# Patient Record
Sex: Male | Born: 1950 | Race: White | Hispanic: No | Marital: Married | Smoking: Never smoker
Health system: Southern US, Community
[De-identification: ages and names within clinical notes are randomized; demographics above are authoritative.]

## PROBLEM LIST (undated history)

## (undated) DIAGNOSIS — I10 Essential (primary) hypertension: Secondary | ICD-10-CM

## (undated) DIAGNOSIS — Z9889 Other specified postprocedural states: Secondary | ICD-10-CM

## (undated) DIAGNOSIS — N200 Calculus of kidney: Secondary | ICD-10-CM

## (undated) DIAGNOSIS — Z87442 Personal history of urinary calculi: Secondary | ICD-10-CM

## (undated) DIAGNOSIS — E7800 Pure hypercholesterolemia, unspecified: Secondary | ICD-10-CM

## (undated) DIAGNOSIS — R7303 Prediabetes: Secondary | ICD-10-CM

## (undated) DIAGNOSIS — G4700 Insomnia, unspecified: Secondary | ICD-10-CM

## (undated) HISTORY — PX: ANKLE SURGERY: SHX546

## (undated) HISTORY — DX: Essential (primary) hypertension: I10

## (undated) HISTORY — PX: OTHER SURGICAL HISTORY: SHX169

---

## 1973-07-19 HISTORY — PX: SHOULDER SURGERY: SHX246

## 1999-06-26 ENCOUNTER — Encounter: Admission: RE | Admit: 1999-06-26 | Discharge: 1999-06-26 | Payer: Self-pay | Admitting: Family Medicine

## 1999-06-26 ENCOUNTER — Encounter: Payer: Self-pay | Admitting: Family Medicine

## 2004-03-16 ENCOUNTER — Ambulatory Visit: Payer: Self-pay | Admitting: Internal Medicine

## 2004-03-16 ENCOUNTER — Inpatient Hospital Stay (HOSPITAL_COMMUNITY): Admission: EM | Admit: 2004-03-16 | Discharge: 2004-03-21 | Payer: Self-pay | Admitting: Orthopedic Surgery

## 2004-04-08 ENCOUNTER — Ambulatory Visit: Payer: Self-pay | Admitting: Internal Medicine

## 2004-05-19 ENCOUNTER — Ambulatory Visit: Payer: Self-pay | Admitting: Internal Medicine

## 2005-08-30 IMAGING — CR DG CHEST 2V
2 series · 2 of 2 positions shown · non-contrast
Comparison: none

CLINICAL DATA: Preoperative respiratory evaluation.  Infected left ankle and foot.  No chest complaints. 
 AP SEMIERECT AND LATERAL CHEST
 Cardiomediastinal silhouette is normal.  Lungs are well expanded and free of active disease.  No pleural effusion is noted.  Regional skeleton is intact.  Soft tissues are normal.
 IMPRESSION
 No active disease.

[view not recorded (1 of 2)]
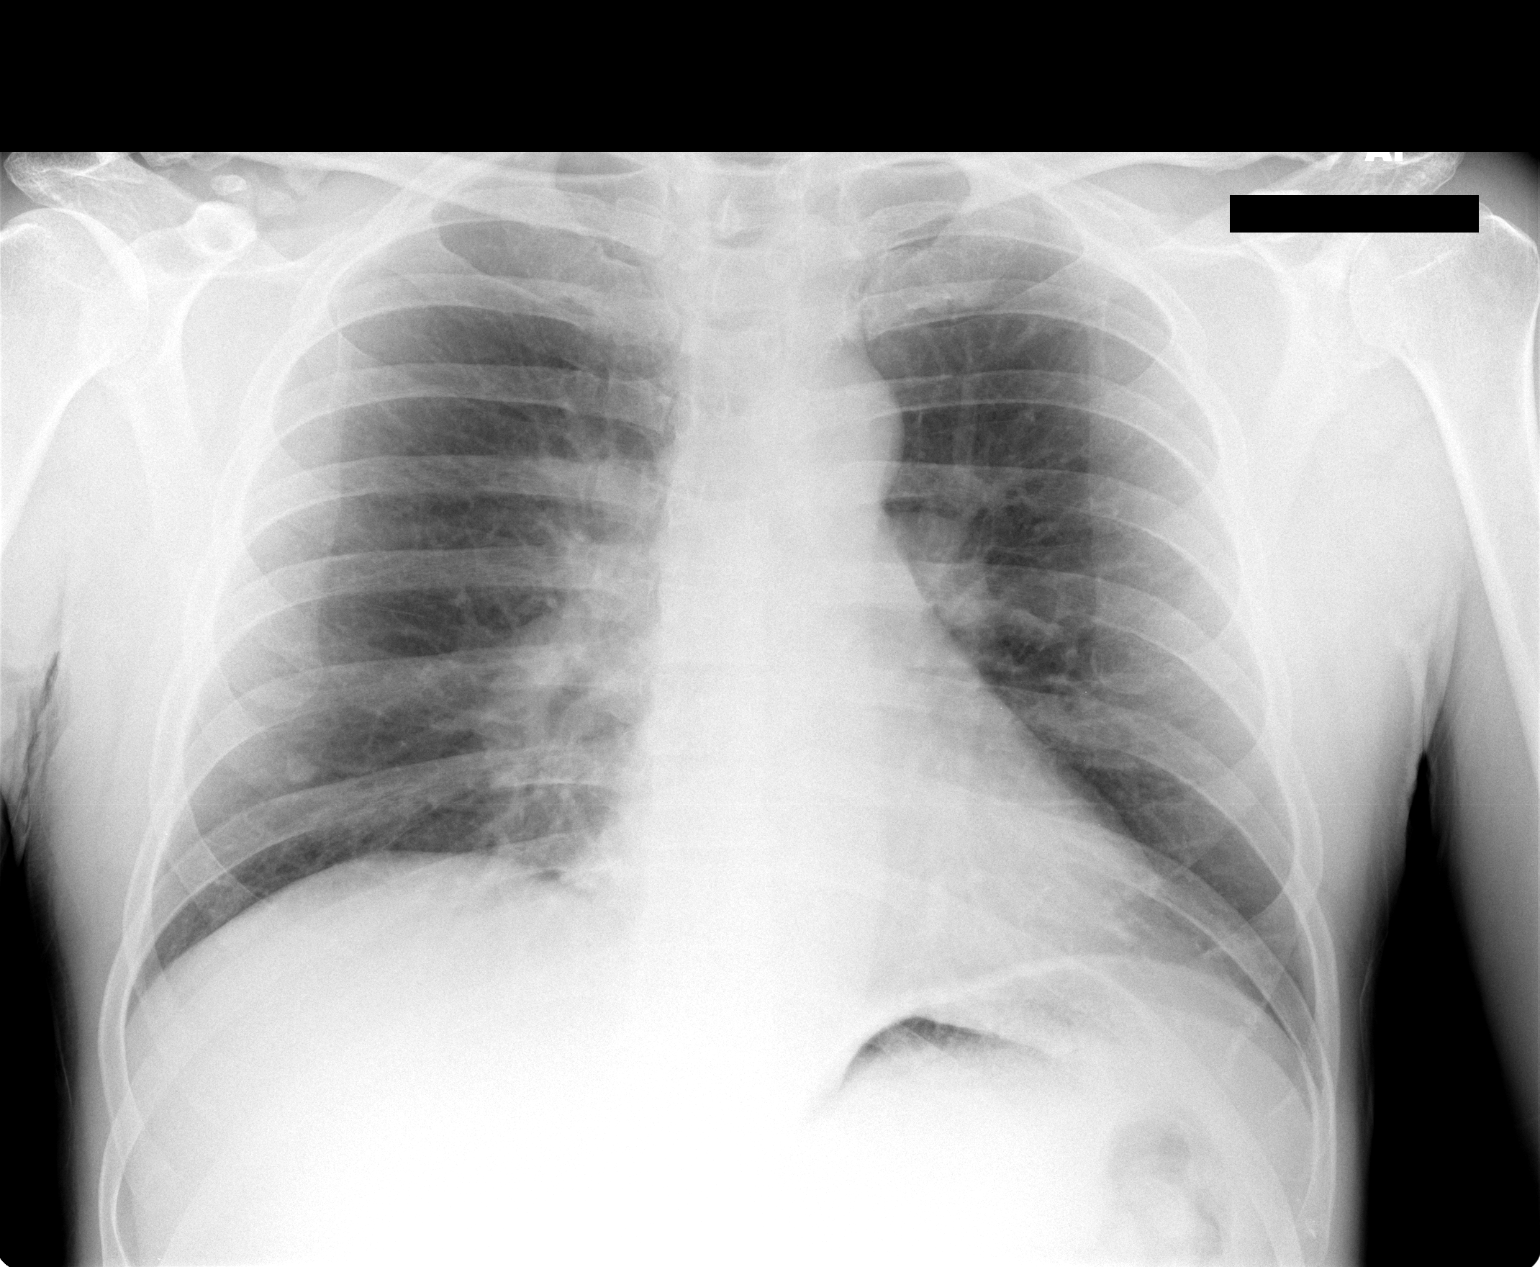

[view not recorded (2 of 2)]
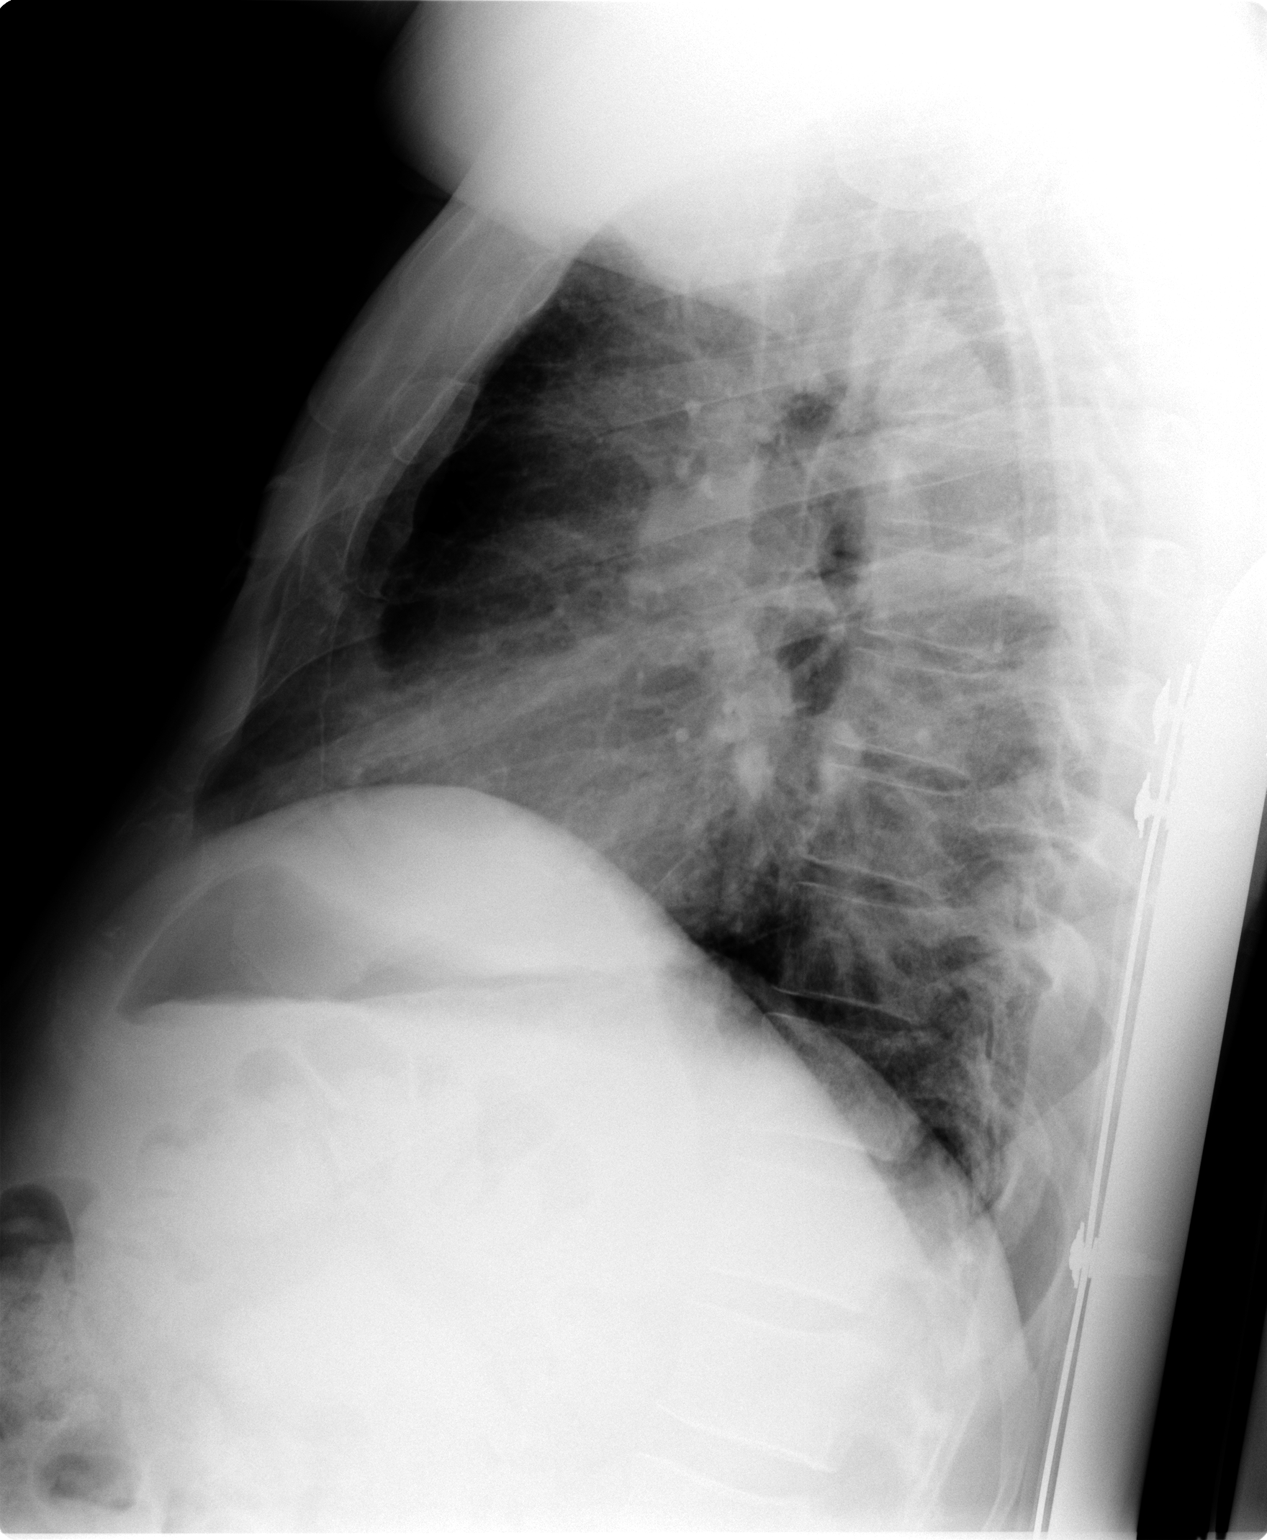

[2 of 2 positions shown; findings below may reference images not displayed]

## 2005-09-02 IMAGING — CR DG CHEST 1V PORT
1 series · 1 of 1 positions shown · non-contrast
Comparison: none

CLINICAL DATA: PICC line placement. 
 PORTABLE CHEST RADIOGRAPH, 03/19/04
 Comparing:  03/16/04.

[view not recorded]
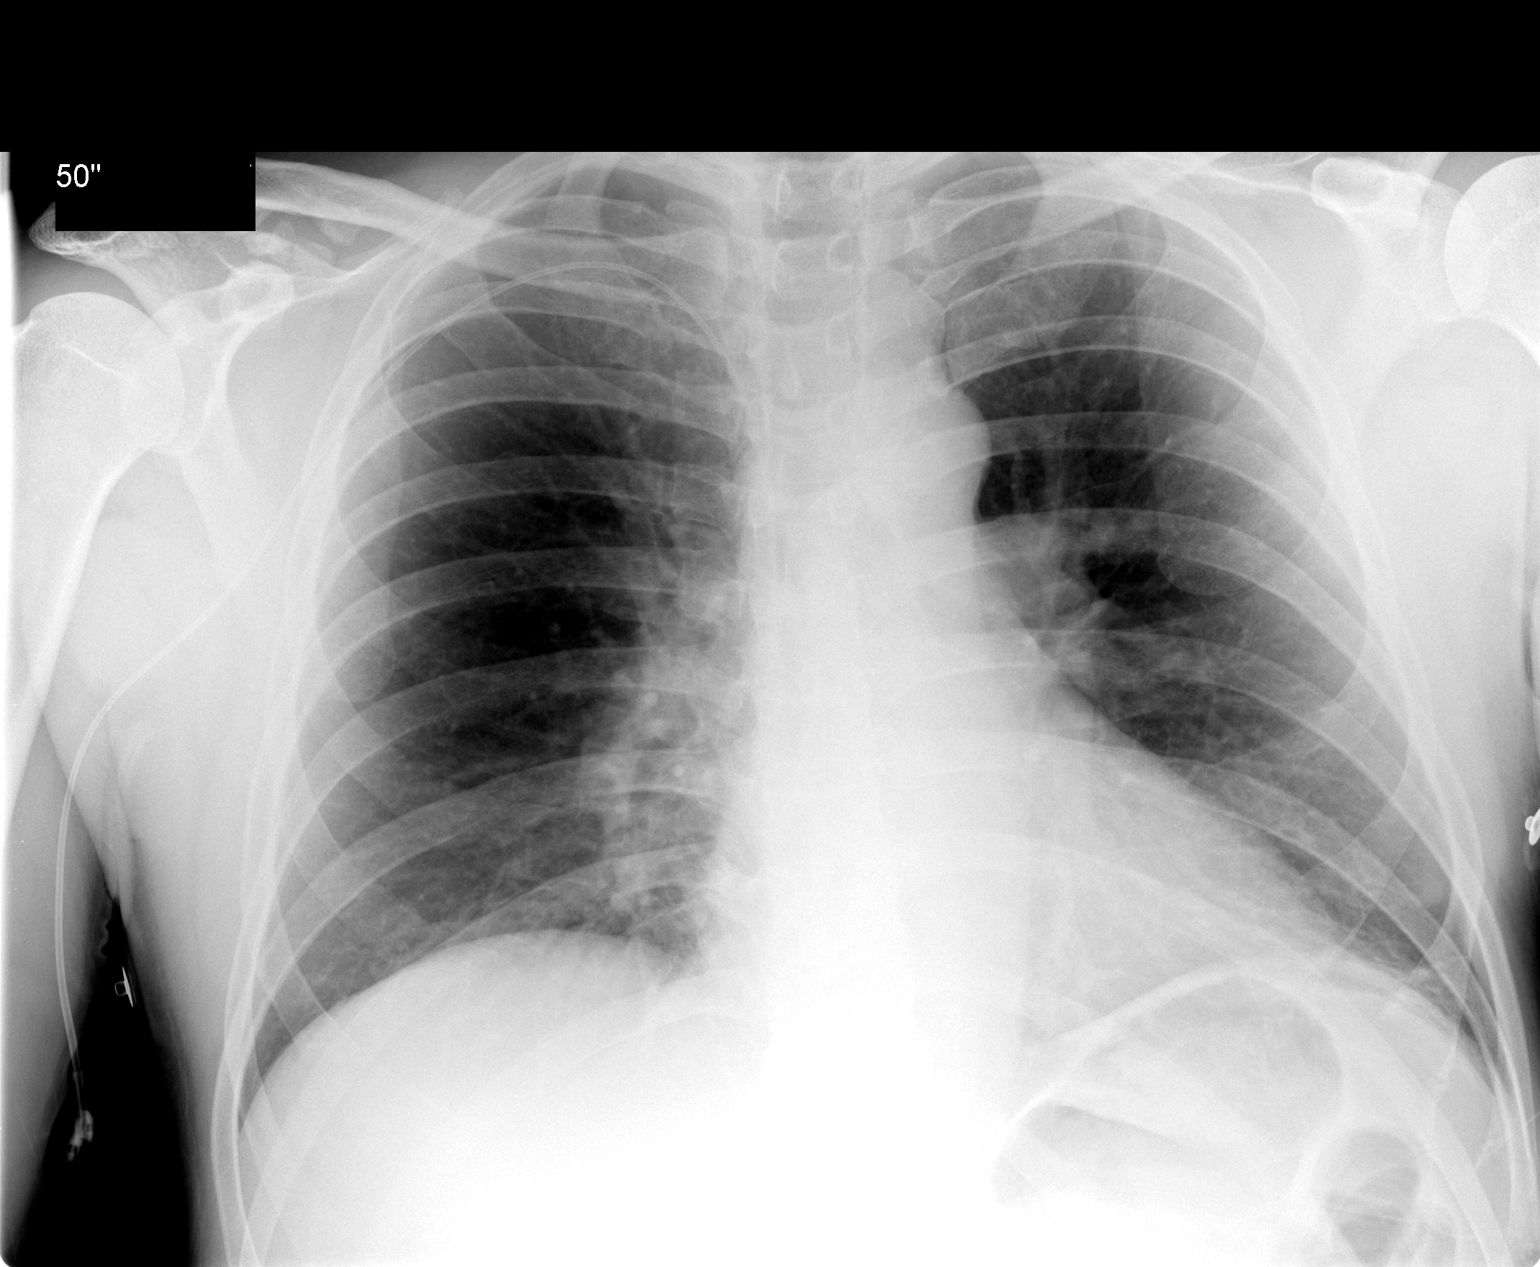

[1 of 1 positions shown; findings below may reference images not displayed]

FINDINGS: The right-sided PICC catheter is present at the brachiocephalic confluence.  Consider advancing 4 cm for placement within the superior vena cava.  
 There is some linear opacity at the left lung base likely representing subsegmental atelectasis.  
 IMPRESSION
 Left basilar subsegmental atelectasis.  
 PICC catheter tip is at the brachiocephalic confluence.

## 2006-07-02 ENCOUNTER — Ambulatory Visit: Payer: Self-pay | Admitting: Hematology & Oncology

## 2006-07-21 LAB — CBC & DIFF AND RETIC
BASO%: 0.3 % (ref 0.0–2.0)
EOS%: 0.3 % (ref 0.0–7.0)
LYMPH%: 28 % (ref 14.0–48.0)
MCHC: 33.2 g/dL (ref 32.0–35.9)
MONO#: 0.4 10*3/uL (ref 0.1–0.9)
Platelets: 282 10*3/uL (ref 145–400)
RBC: 4.01 10*6/uL — ABNORMAL LOW (ref 4.20–5.71)
Retic %: 1.5 % (ref 0.7–2.3)
WBC: 4.5 10*3/uL (ref 4.0–10.0)

## 2006-07-21 LAB — CHCC SMEAR

## 2006-07-25 LAB — PROTEIN ELECTROPHORESIS, SERUM
Beta 2: 32.9 % — ABNORMAL HIGH (ref 3.2–6.5)
Beta Globulin: 5.3 % (ref 4.7–7.2)
Gamma Globulin: 2.7 % — ABNORMAL LOW (ref 11.1–18.8)
M-Spike, %: 2.8 g/dL
Total Protein, Serum Electrophoresis: 9.9 g/dL — ABNORMAL HIGH (ref 6.0–8.3)

## 2006-10-23 ENCOUNTER — Emergency Department (HOSPITAL_COMMUNITY): Admission: EM | Admit: 2006-10-23 | Discharge: 2006-10-23 | Payer: Self-pay | Admitting: Emergency Medicine

## 2011-06-08 ENCOUNTER — Other Ambulatory Visit: Payer: Self-pay | Admitting: Family Medicine

## 2011-06-08 DIAGNOSIS — M25511 Pain in right shoulder: Secondary | ICD-10-CM

## 2011-06-16 ENCOUNTER — Other Ambulatory Visit: Payer: Self-pay

## 2011-08-04 ENCOUNTER — Ambulatory Visit
Admission: RE | Admit: 2011-08-04 | Discharge: 2011-08-04 | Disposition: A | Discharge status: Home / Self Care | Payer: PRIVATE HEALTH INSURANCE | Source: Ambulatory Visit | Attending: Family Medicine | Admitting: Family Medicine

## 2011-08-04 DIAGNOSIS — M25511 Pain in right shoulder: Secondary | ICD-10-CM

## 2011-08-04 MED ORDER — GADOBENATE DIMEGLUMINE 529 MG/ML IV SOLN
15.0000 mL | Freq: Once | INTRAVENOUS | Status: AC | PRN
  Administered 2011-08-04: 15 mL via INTRAVENOUS

## 2013-04-27 ENCOUNTER — Emergency Department (HOSPITAL_COMMUNITY): Payer: BC Managed Care – PPO

## 2013-04-27 ENCOUNTER — Encounter (HOSPITAL_COMMUNITY): Payer: Self-pay | Admitting: Emergency Medicine

## 2013-04-27 ENCOUNTER — Emergency Department (HOSPITAL_COMMUNITY)
Admission: EM | Admit: 2013-04-27 | Discharge: 2013-04-27 | Disposition: A | Discharge status: Home / Self Care | Payer: BC Managed Care – PPO | Attending: Emergency Medicine | Admitting: Emergency Medicine

## 2013-04-27 DIAGNOSIS — N200 Calculus of kidney: Secondary | ICD-10-CM | POA: Insufficient documentation

## 2013-04-27 DIAGNOSIS — R109 Unspecified abdominal pain: Secondary | ICD-10-CM

## 2013-04-27 DIAGNOSIS — R1031 Right lower quadrant pain: Secondary | ICD-10-CM | POA: Insufficient documentation

## 2013-04-27 DIAGNOSIS — E7800 Pure hypercholesterolemia, unspecified: Secondary | ICD-10-CM | POA: Insufficient documentation

## 2013-04-27 DIAGNOSIS — G4700 Insomnia, unspecified: Secondary | ICD-10-CM | POA: Insufficient documentation

## 2013-04-27 HISTORY — DX: Pure hypercholesterolemia, unspecified: E78.00

## 2013-04-27 HISTORY — DX: Calculus of kidney: N20.0

## 2013-04-27 HISTORY — DX: Insomnia, unspecified: G47.00

## 2013-04-27 LAB — COMPREHENSIVE METABOLIC PANEL
ALT: 36 U/L (ref 0–53)
Alkaline Phosphatase: 55 U/L (ref 39–117)
BUN: 25 mg/dL — ABNORMAL HIGH (ref 6–23)
Chloride: 102 mEq/L (ref 96–112)
GFR calc Af Amer: 71 mL/min — ABNORMAL LOW (ref >90)
Glucose, Bld: 104 mg/dL — ABNORMAL HIGH (ref 70–99)
Potassium: 4 mEq/L (ref 3.5–5.1)
Sodium: 136 mEq/L (ref 135–145)
Total Bilirubin: 0.3 mg/dL (ref 0.3–1.2)
Total Protein: 8.7 g/dL — ABNORMAL HIGH (ref 6.0–8.3)

## 2013-04-27 LAB — CBC WITH DIFFERENTIAL/PLATELET
HCT: 37.2 % — ABNORMAL LOW (ref 39.0–52.0)
Hemoglobin: 12.5 g/dL — ABNORMAL LOW (ref 13.0–17.0)
Lymphocytes Relative: 11 % — ABNORMAL LOW (ref 12–46)
Lymphs Abs: 1 10*3/uL (ref 0.7–4.0)
Monocytes Absolute: 0.7 10*3/uL (ref 0.1–1.0)
Monocytes Relative: 7 % (ref 3–12)
Neutro Abs: 7.3 10*3/uL (ref 1.7–7.7)
Neutrophils Relative %: 81 % — ABNORMAL HIGH (ref 43–77)
RBC: 3.93 MIL/uL — ABNORMAL LOW (ref 4.22–5.81)
WBC: 9 10*3/uL (ref 4.0–10.5)

## 2013-04-27 LAB — URINALYSIS, ROUTINE W REFLEX MICROSCOPIC
Glucose, UA: NEGATIVE mg/dL
Ketones, ur: NEGATIVE mg/dL
Leukocytes, UA: NEGATIVE
Nitrite: NEGATIVE
Specific Gravity, Urine: 1.03 (ref 1.005–1.030)
pH: 5 (ref 5.0–8.0)

## 2013-04-27 LAB — URINE MICROSCOPIC-ADD ON

## 2013-04-27 MED ORDER — HYDROMORPHONE HCL PF 1 MG/ML IJ SOLN
1.0000 mg | Freq: Once | INTRAMUSCULAR | Status: AC
  Administered 2013-04-27: 1 mg via INTRAVENOUS
  Filled 2013-04-27: qty 1

## 2013-04-27 MED ORDER — OXYCODONE-ACETAMINOPHEN 5-325 MG PO TABS: 1.0000 | ORAL_TABLET | ORAL | Status: DC | PRN

## 2013-04-27 MED ORDER — MORPHINE SULFATE 4 MG/ML IJ SOLN: 4.0000 mg | Freq: Once | INTRAMUSCULAR | Status: DC

## 2013-04-27 MED ORDER — TAMSULOSIN HCL 0.4 MG PO CAPS: 0.4000 mg | ORAL_CAPSULE | Freq: Every day | ORAL | Status: DC

## 2013-04-27 MED ORDER — ONDANSETRON HCL 4 MG/2ML IJ SOLN
4.0000 mg | Freq: Once | INTRAMUSCULAR | Status: AC
  Administered 2013-04-27: 4 mg via INTRAVENOUS
  Filled 2013-04-27: qty 2

## 2013-04-27 MED ORDER — SODIUM CHLORIDE 0.9 % IV BOLUS (SEPSIS)
1000.0000 mL | Freq: Once | INTRAVENOUS | Status: AC
  Administered 2013-04-27: 1000 mL via INTRAVENOUS

## 2013-04-27 NOTE — ED Provider Notes (Signed)
CSN: [id number]     Arrival date & time 04/27/13  0809 History   First MD Initiated Contact with Patient 04/27/13 [phone]     Chief Complaint  Patient presents with  . Flank Pain   (Consider location/radiation/quality/duration/timing/severity/associated sxs/prior Treatment) HPI Comments: 62 yo male with right flank pain which started yesterday.  Started in his right flank and now has moved to his RLQ.  No fevers.  No nausea and vomiting.  No diarrhea or constipation.  He has noticed some mild blood in his urine.  He reports intermittent stabbing pain which was very severe this morning.   Patient is a 62 y.o. male presenting with flank pain.  Flank Pain This is a recurrent problem. Episode onset: last night. The problem occurs constantly. The problem has been gradually worsening. Associated symptoms include abdominal pain (RLQ). Pertinent negatives include no chest pain and no shortness of breath. Nothing aggravates the symptoms. Nothing relieves the symptoms.    Past Medical History  Diagnosis Date  . Kidney stone   . High cholesterol   . Insomnia    No past surgical history on file. No family history on file. History  Substance Use Topics  . Smoking status: Never Smoker   . Smokeless tobacco: Not on file  . Alcohol Use: No    Review of Systems  Constitutional: Negative for fever.  HENT: Negative for congestion.   Respiratory: Negative for cough and shortness of breath.   Cardiovascular: Negative for chest pain.  Gastrointestinal: Positive for abdominal pain (RLQ). Negative for nausea, vomiting and diarrhea.  Genitourinary: Positive for flank pain.  All other systems reviewed and are negative.    Allergies  Review of patient's allergies indicates no known allergies.  Home Medications  No current outpatient prescriptions on file. BP 171/88  Pulse 58  Temp(Src) 98.3 F (36.8 C) (Oral)  Resp 18  SpO2 100% Physical Exam  Nursing note and vitals  reviewed. Constitutional: He is oriented to person, place, and time. He appears well-developed and well-nourished. No distress.  HENT:  Head: Normocephalic and atraumatic.  Mouth/Throat: Oropharynx is clear and moist.  Eyes: Conjunctivae are normal. Pupils are equal, round, and reactive to light. No scleral icterus.  Neck: Neck supple.  Cardiovascular: Normal rate, regular rhythm, normal heart sounds and intact distal pulses.   No murmur heard. Pulmonary/Chest: Effort normal and breath sounds normal. No stridor. No respiratory distress. He has no wheezes. He has no rales.  Abdominal: Soft. He exhibits no distension. There is tenderness in the right lower quadrant. There is no rigidity, no rebound, no guarding and no CVA tenderness.  Musculoskeletal: Normal range of motion. He exhibits no edema.  Neurological: He is alert and oriented to person, place, and time.  Skin: Skin is warm and dry. No rash noted.  Psychiatric: He has a normal mood and affect. His behavior is normal.    ED Course  Procedures (including critical care time) Labs Review Labs Reviewed  URINALYSIS, ROUTINE W REFLEX MICROSCOPIC - Abnormal; Notable for the following:    APPearance CLOUDY (*)    Hgb urine dipstick LARGE (*)    Protein, ur 30 (*)    All other components within normal limits  CBC WITH DIFFERENTIAL - Abnormal; Notable for the following:    RBC 3.93 (*)    Hemoglobin 12.5 (*)    HCT 37.2 (*)    Neutrophils Relative % 81 (*)    Lymphocytes Relative 11 (*)    All other components within  normal limits  COMPREHENSIVE METABOLIC PANEL - Abnormal; Notable for the following:    Glucose, Bld 104 (*)    BUN 25 (*)    Total Protein 8.7 (*)    GFR calc non Af Amer 61 (*)    GFR calc Af Amer 71 (*)    All other components within normal limits  LIPASE, BLOOD  URINE MICROSCOPIC-ADD ON   Imaging Review US Renal  04/27/2013   CLINICAL DATA:  Right flank pain. History of prior kidney stones.  EXAM:  RENAL/URINARY TRACT ULTRASOUND COMPLETE  COMPARISON:  11/01/2006.  FINDINGS: Right Kidney  Length: 13.3 cm. No hydronephrosis. No calculi or obstruction identified. 66 mm x 54 mm x 63 mm cystic lesion is present in the right upper pole. There is reverberation artifact along the posterior wall of the cyst. No septations or calcification are identified.  Left Kidney  Length: 12 cm. 35 mm x 33 mm x 31 mm left upper renal pole cystic lesion is present with septations. This head is increased through transmission and no internal vascular flow.  Bladder  Nonvisualization of right ureteral jet. Left ureteral jet present.  IMPRESSION: 1. No acute abnormality. Negative for hydronephrosis or calculi. 2. Bilateral renal cysts, with mild complexity of the left renal cyst. Right renal cyst appears simple and the left renal cyst demonstrates a few thin septations. 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   Electronically Signed   By: Andreas Newport M.D.   On: 04/27/2013 11:33   US Aorta  04/27/2013   CLINICAL DATA:  Flank pain. Abdominal aortic aneurysm.  Groin pain.  EXAM: ULTRASOUND OF ABDOMINAL AORTA  TECHNIQUE: Ultrasound examination of the abdominal aorta was performed to evaluate for abdominal aortic aneurysm.  COMPARISON:  None.  FINDINGS: Abdominal Aorta  No aneurysm identified.  Maximum AP  Diameter:  2.4 cm  Maximum TRV  Diameter: 2.0 cm  IMPRESSION: Normal abdominal aorta. Negative for aneurysm.   Electronically Signed   By: Andreas Newport M.D.   On: 04/27/2013 13:15    EKG Interpretation   None       MDM   1. Abdominal pain   2. Kidney stone    62 yo male with hx of kidney stones who reports onset of right flank pain, now right LQ pain which started last night. Feels like prior kidney stones.  Well appearing now, but wife reports severe pain and pacing earlier this morning.  I have a low suspicion for appendicitis based on his presentation.  However, we discussed obtaining CT to  evaluate for possible appendicitis or other intraabdominal process, but he elected to proceed with ultrasound to evaluate for kidney stone/hydronephrosis.    Ultrasound negative for hydro.  Pt pain controlled.  He does not have a AAA.  Discharged with pain control, flomax, and urology follow up.  Abdominal exam benign on recheck prior to discharge.    Candyce Churn, MD 04/27/13 [phone]

## 2013-04-27 NOTE — ED Notes (Signed)
Pt reports right sided flank pain that began yesterday. States he saw blood in urine last night. Hx of kidney stones. Denies n/v

## 2013-04-27 NOTE — ED Notes (Signed)
Patient has a hx of kidney stones. States that it feels like that pain.

## 2013-04-27 NOTE — Progress Notes (Signed)
pcp is Dr Tiburcio Pea EPIC updated

## 2014-10-11 IMAGING — US US RENAL
1 series · 14 of 25 positions shown · non-contrast
Comparison: 11/01/2006.

CLINICAL DATA: Right flank pain. History of prior kidney stones.

EXAM:
RENAL/URINARY TRACT ULTRASOUND COMPLETE

[Series 1: us renal · 0.22mm/px · 14 of 44 slices shown]
[im 1/44]
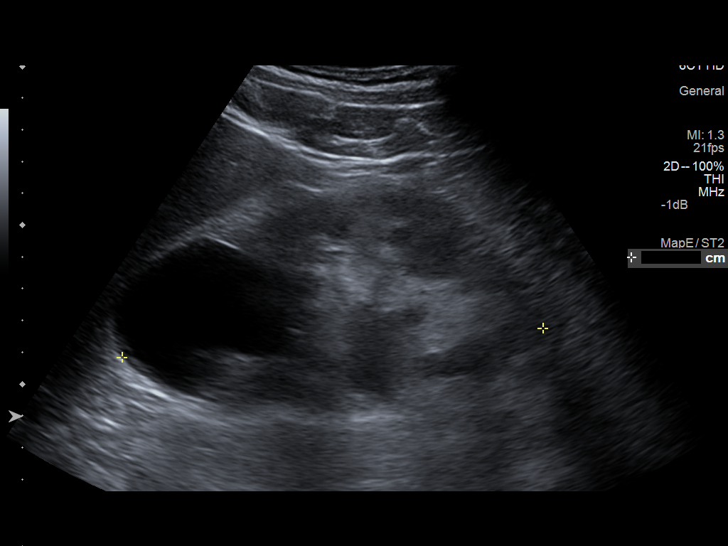
[im 4/44]
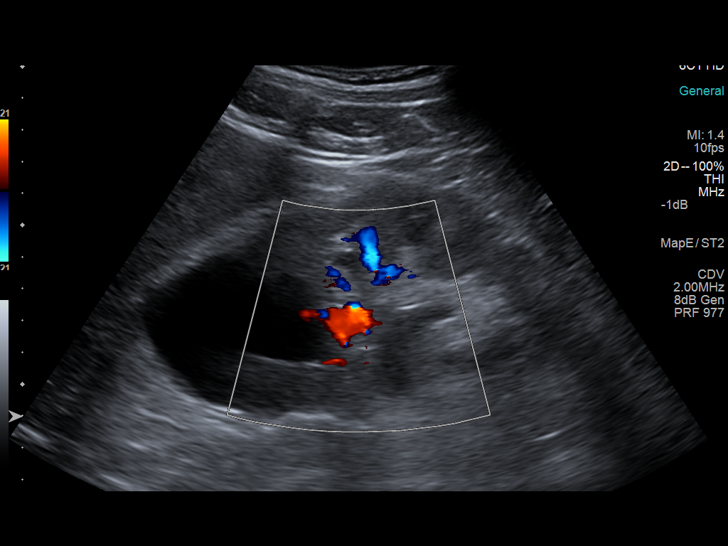
[im 8/44]
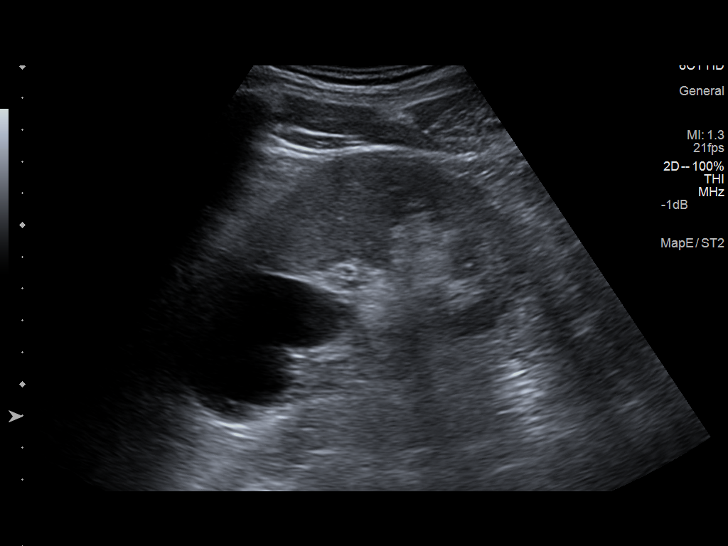
[im 11/44]
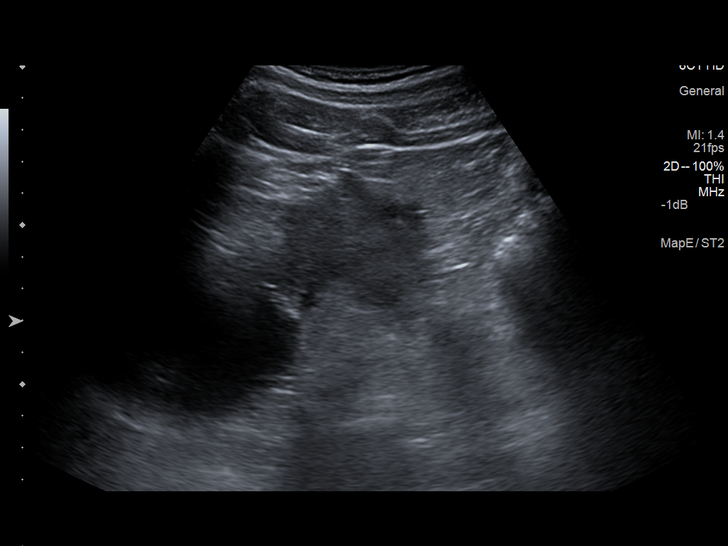
[im 15/44]
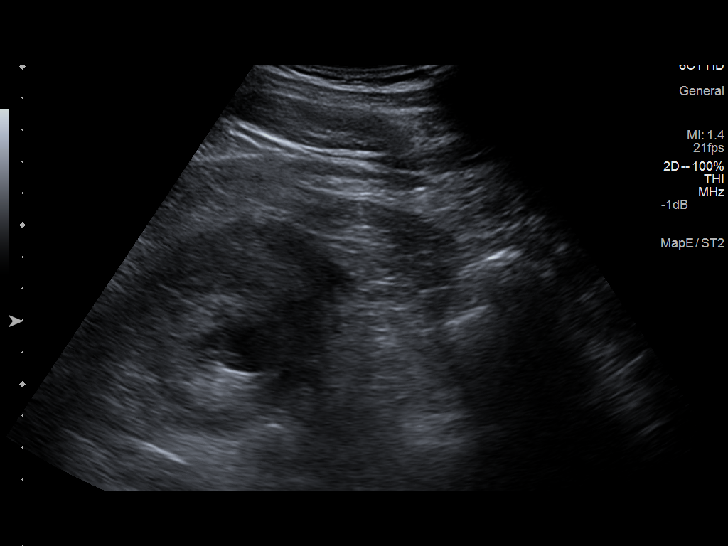
[im 17/44]
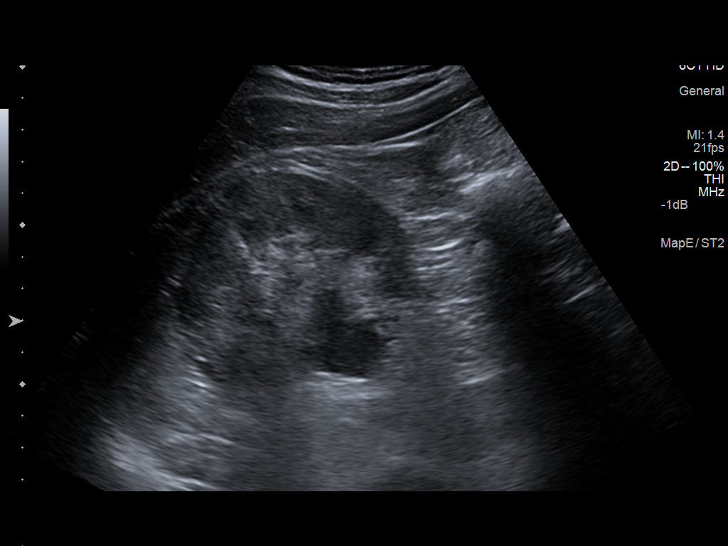
[im 20/44]
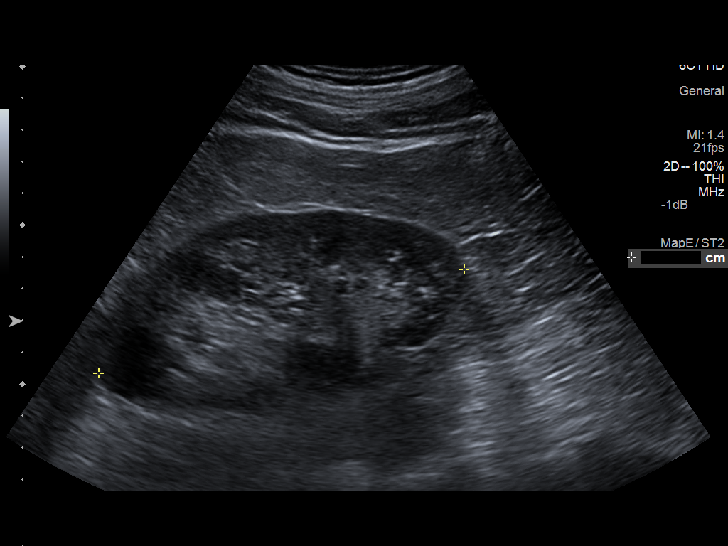
[im 24/44]
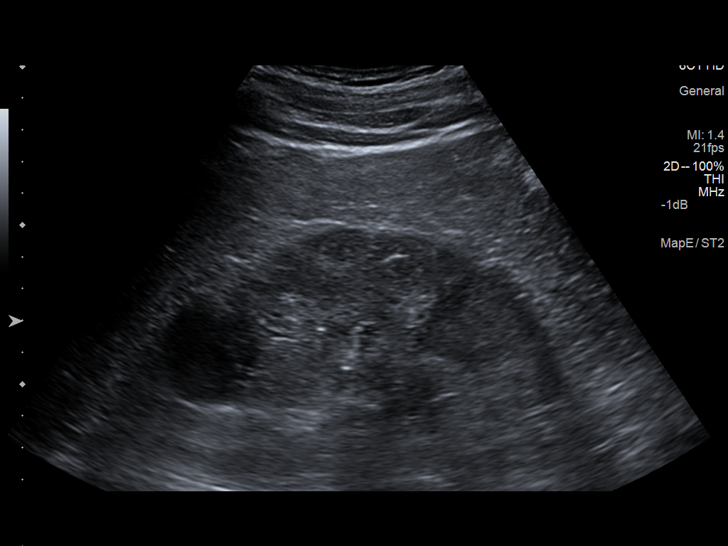
[im 27/44]
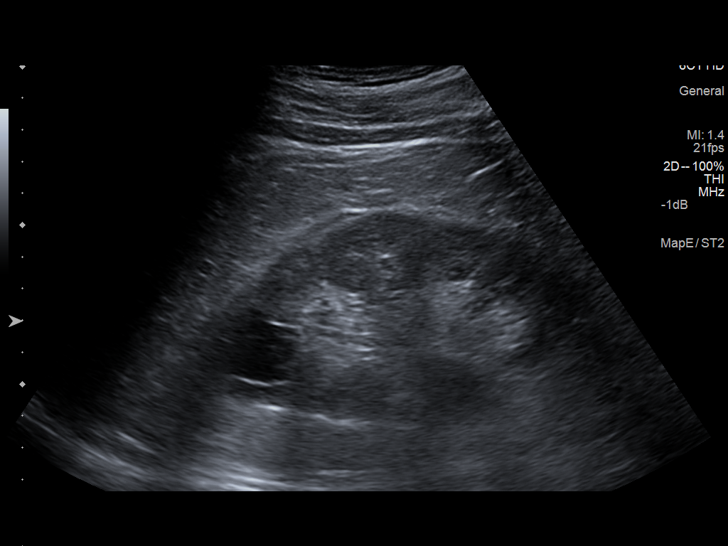
[im 29/44]
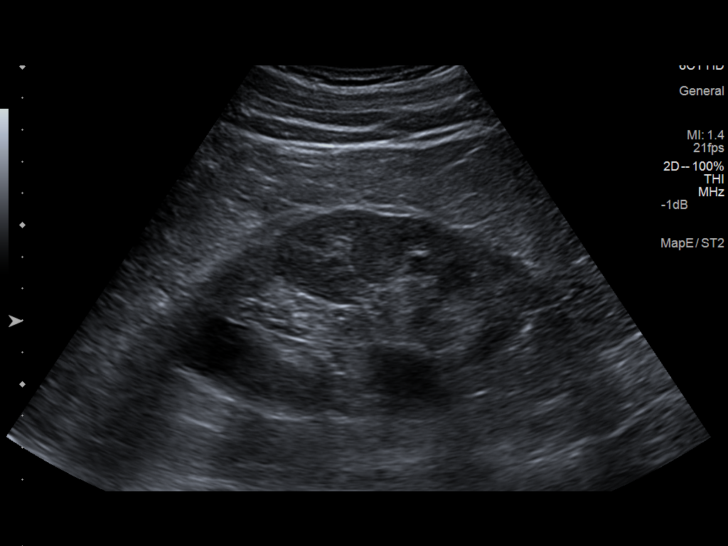
[im 33/44]
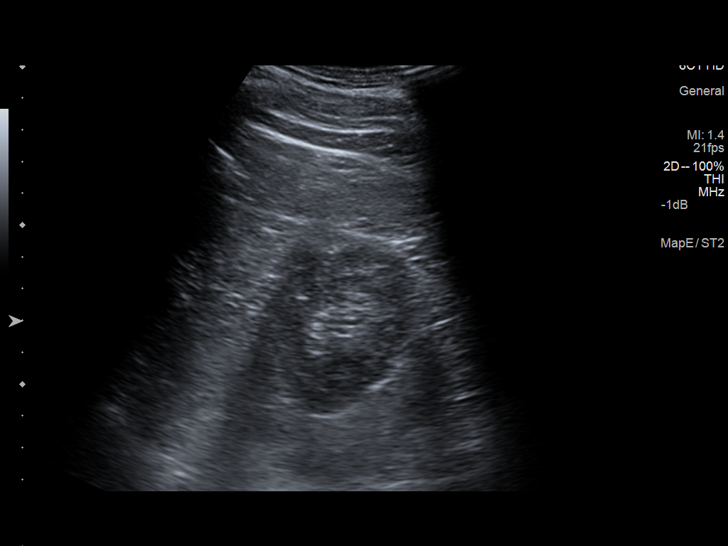
[im 36/44]
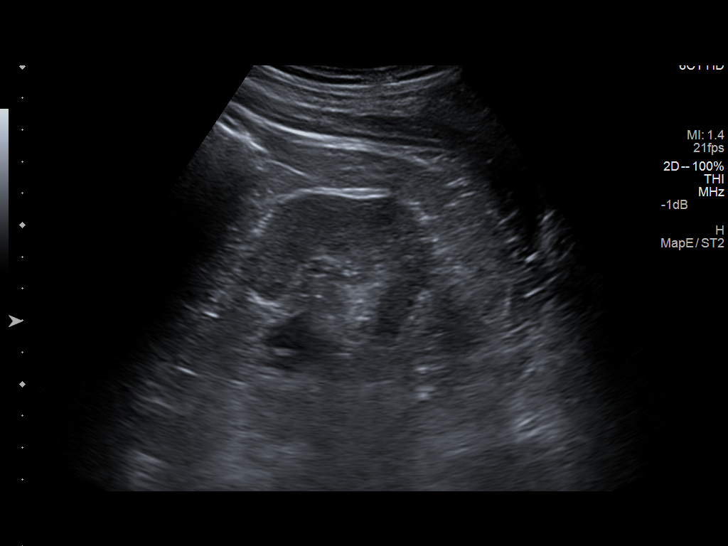
[im 40/44]
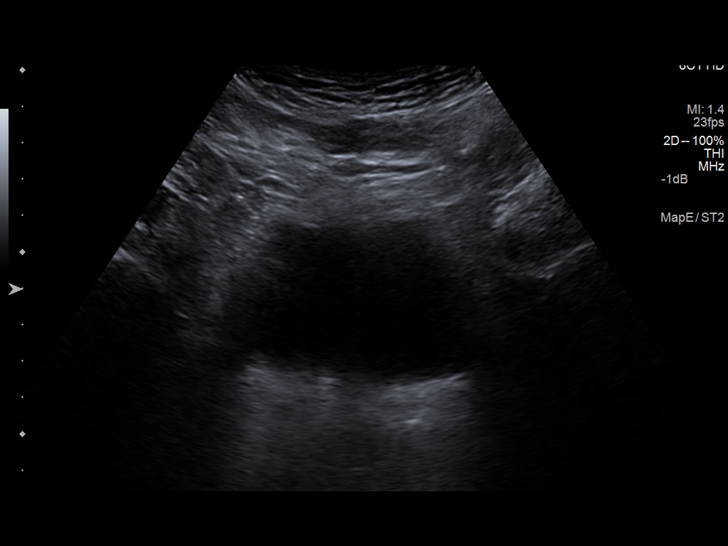
[im 44/44]
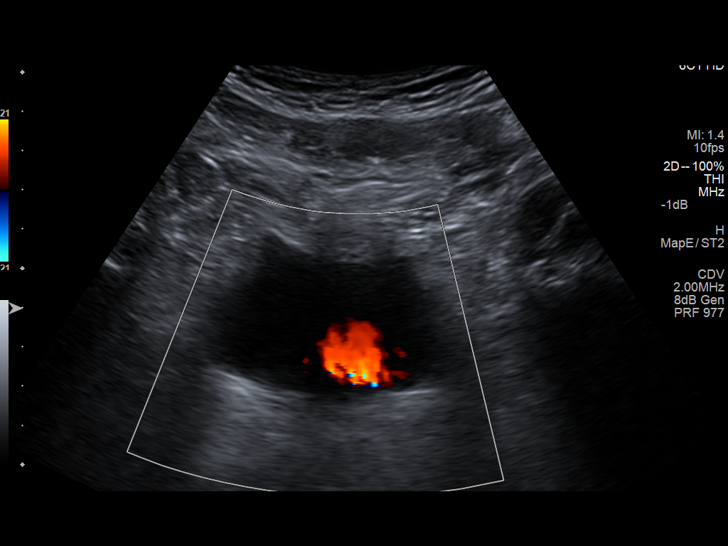

[14 of 25 positions shown; findings below may reference images not displayed]

FINDINGS: Right Kidney

Length: 13.3 cm. No hydronephrosis. No calculi or obstruction
identified. 66 mm x 54 mm x 63 mm cystic lesion is present in the
right upper pole. There is reverberation artifact along the
posterior wall of the cyst. No septations or calcification are
identified.

Left Kidney

Length: 12 cm. 35 mm x 33 mm x 31 mm left upper renal pole cystic
lesion is present with septations. This head is increased through
transmission and no internal vascular flow.

Bladder

Nonvisualization of right ureteral jet. Left ureteral jet present.
IMPRESSION: 1. No acute abnormality. Negative for hydronephrosis or calculi.
2. Bilateral renal cysts, with mild complexity of the left renal
cyst. Right renal cyst appears simple and the left renal cyst
demonstrates a few thin septations. Both of these are compatible
with benign Bosniak 2 cysts.

## 2014-10-11 IMAGING — US US AORTA
1 series · 13 of 13 positions shown · non-contrast
Comparison: None.

CLINICAL DATA: Flank pain. Abdominal aortic aneurysm.  Groin pain.

EXAM:
ULTRASOUND OF ABDOMINAL AORTA
TECHNIQUE: Ultrasound examination of the abdominal aorta was performed to
evaluate for abdominal aortic aneurysm.

[Series 1: us aorta · 0.27mm/px · 13 of 13 slices shown]
[im 1/13]
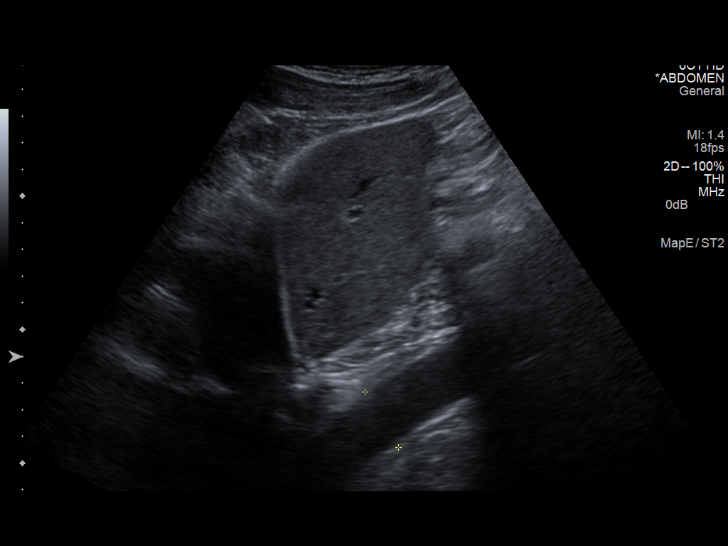
[im 2/13]
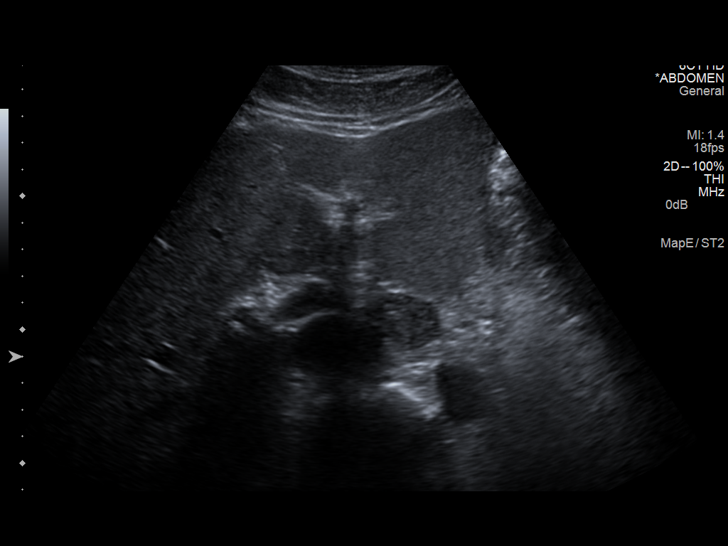
[im 3/13]
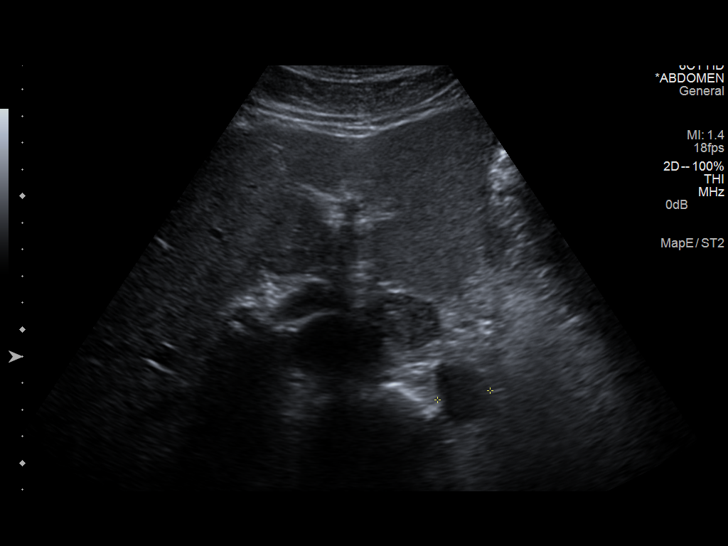
[im 4/13]
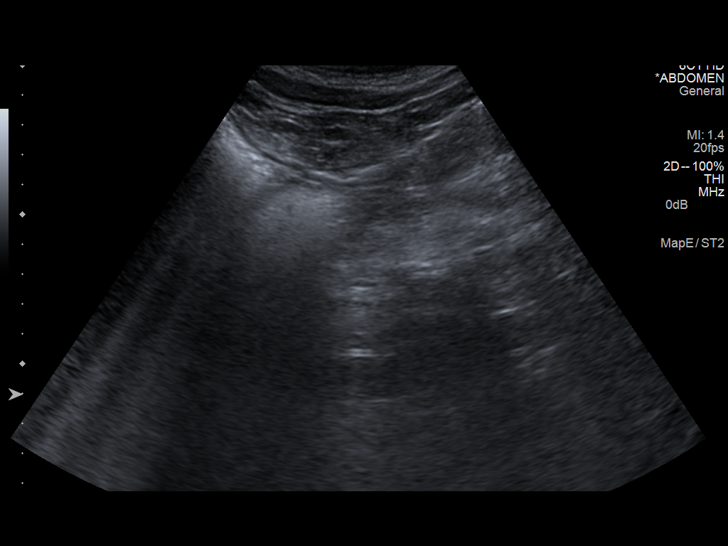
[im 5/13]
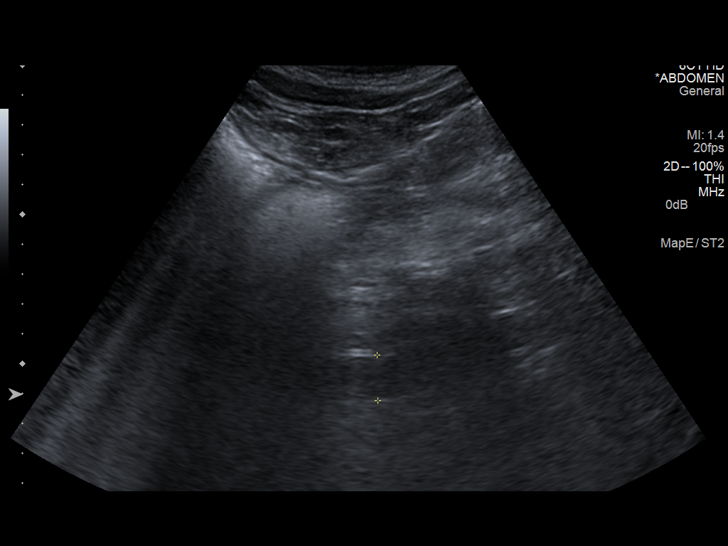
[im 6/13]
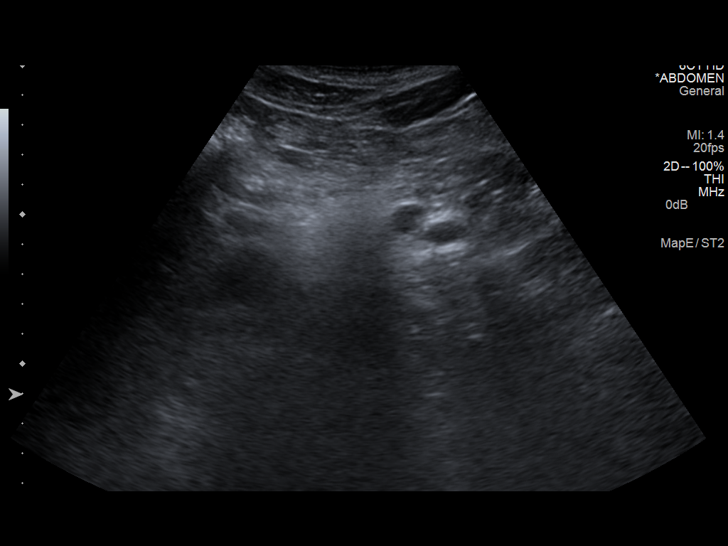
[im 7/13]
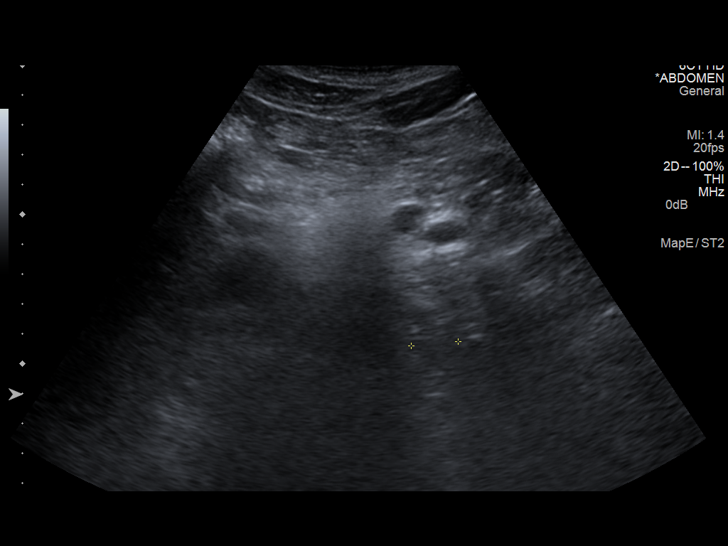
[im 8/13]
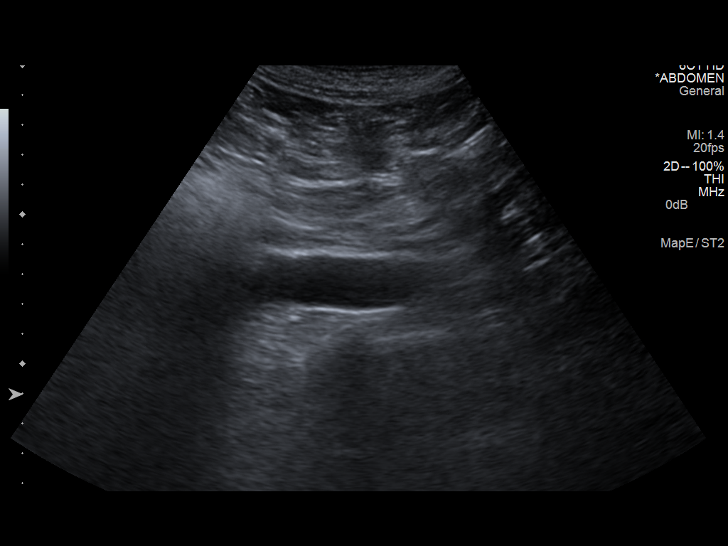
[im 9/13]
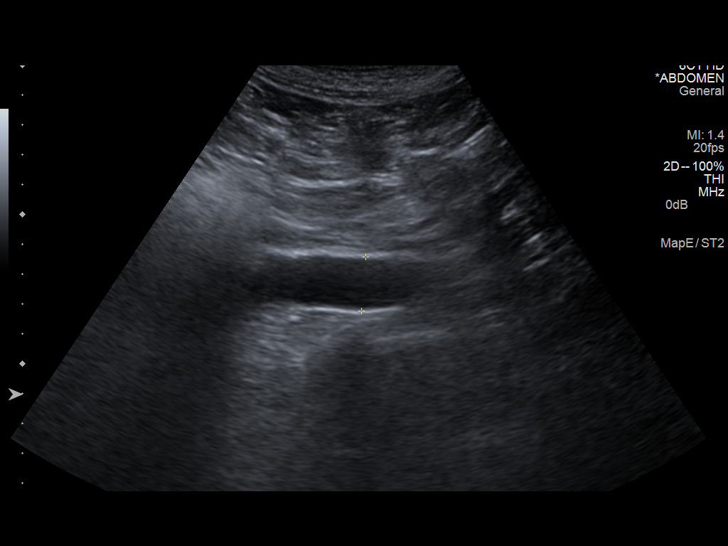
[im 10/13]
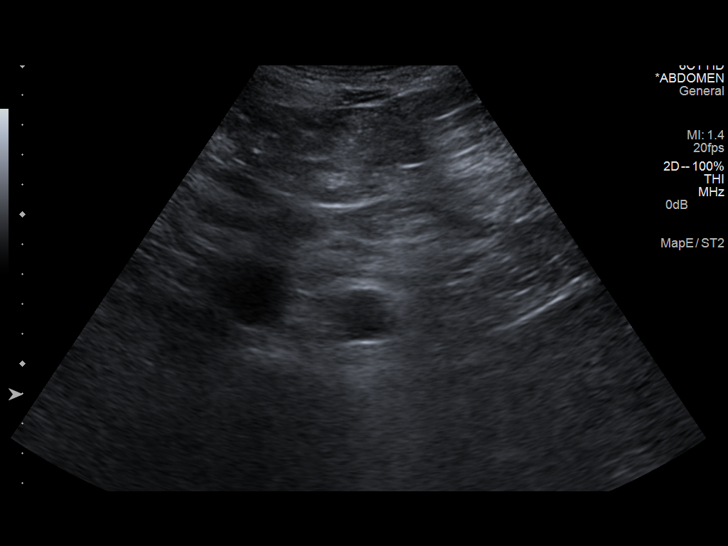
[im 11/13]
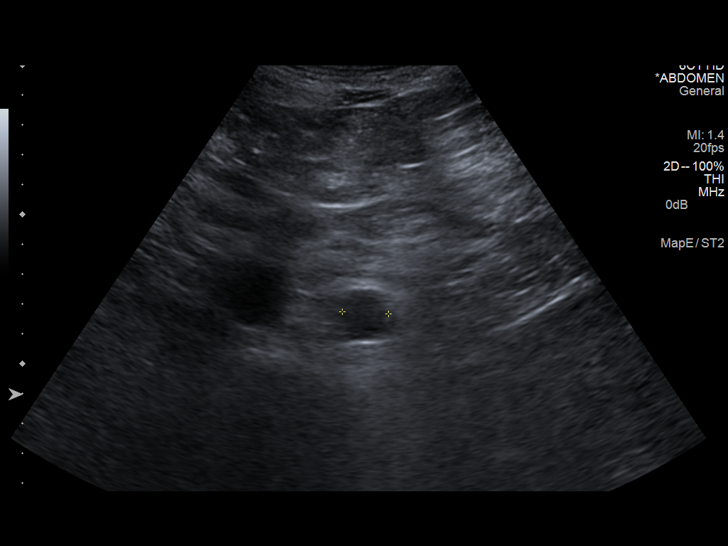
[im 12/13]
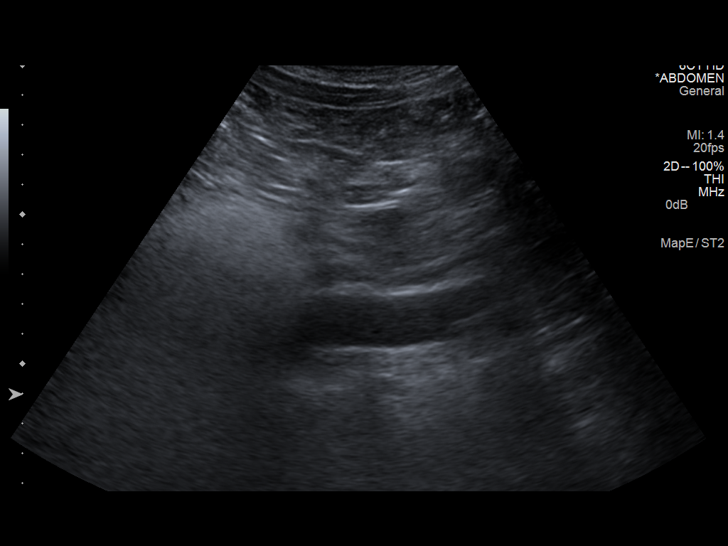
[im 13/13]
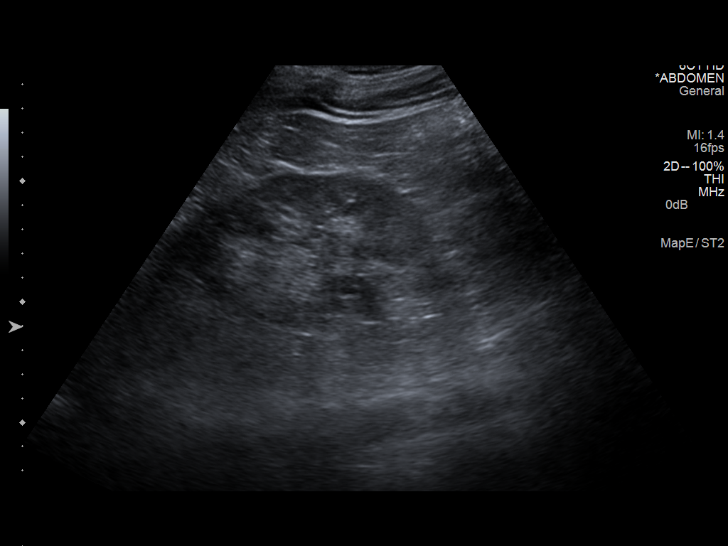

[13 of 13 positions shown; findings below may reference images not displayed]

FINDINGS: Abdominal Aorta

No aneurysm identified.

Maximum AP

Diameter:  2.4 cm

Maximum TRV

Diameter: 2.0 cm
IMPRESSION: Normal abdominal aorta. Negative for aneurysm.

## 2016-04-19 DIAGNOSIS — H40013 Open angle with borderline findings, low risk, bilateral: Secondary | ICD-10-CM | POA: Diagnosis not present

## 2016-05-03 DIAGNOSIS — Z23 Encounter for immunization: Secondary | ICD-10-CM | POA: Diagnosis not present

## 2016-08-27 DIAGNOSIS — Z125 Encounter for screening for malignant neoplasm of prostate: Secondary | ICD-10-CM | POA: Diagnosis not present

## 2016-08-27 DIAGNOSIS — Z23 Encounter for immunization: Secondary | ICD-10-CM | POA: Diagnosis not present

## 2016-08-27 DIAGNOSIS — E785 Hyperlipidemia, unspecified: Secondary | ICD-10-CM | POA: Diagnosis not present

## 2016-08-27 DIAGNOSIS — Z1159 Encounter for screening for other viral diseases: Secondary | ICD-10-CM | POA: Diagnosis not present

## 2016-08-27 DIAGNOSIS — Z0000 Encounter for general adult medical examination without abnormal findings: Secondary | ICD-10-CM | POA: Diagnosis not present

## 2016-08-27 DIAGNOSIS — F5101 Primary insomnia: Secondary | ICD-10-CM | POA: Diagnosis not present

## 2016-08-27 DIAGNOSIS — R7303 Prediabetes: Secondary | ICD-10-CM | POA: Diagnosis not present

## 2016-10-18 DIAGNOSIS — J019 Acute sinusitis, unspecified: Secondary | ICD-10-CM | POA: Diagnosis not present

## 2016-11-04 DIAGNOSIS — J0100 Acute maxillary sinusitis, unspecified: Secondary | ICD-10-CM | POA: Diagnosis not present

## 2017-01-05 DIAGNOSIS — R21 Rash and other nonspecific skin eruption: Secondary | ICD-10-CM | POA: Diagnosis not present

## 2017-04-27 DIAGNOSIS — L578 Other skin changes due to chronic exposure to nonionizing radiation: Secondary | ICD-10-CM | POA: Diagnosis not present

## 2017-04-27 DIAGNOSIS — L301 Dyshidrosis [pompholyx]: Secondary | ICD-10-CM | POA: Diagnosis not present

## 2017-04-28 DIAGNOSIS — Z23 Encounter for immunization: Secondary | ICD-10-CM | POA: Diagnosis not present

## 2017-05-12 DIAGNOSIS — Z83511 Family history of glaucoma: Secondary | ICD-10-CM | POA: Diagnosis not present

## 2017-05-12 DIAGNOSIS — H40013 Open angle with borderline findings, low risk, bilateral: Secondary | ICD-10-CM | POA: Diagnosis not present

## 2017-05-12 DIAGNOSIS — H40053 Ocular hypertension, bilateral: Secondary | ICD-10-CM | POA: Diagnosis not present

## 2017-05-30 DIAGNOSIS — L821 Other seborrheic keratosis: Secondary | ICD-10-CM | POA: Diagnosis not present

## 2017-05-30 DIAGNOSIS — L578 Other skin changes due to chronic exposure to nonionizing radiation: Secondary | ICD-10-CM | POA: Diagnosis not present

## 2017-05-30 DIAGNOSIS — L301 Dyshidrosis [pompholyx]: Secondary | ICD-10-CM | POA: Diagnosis not present

## 2017-06-23 DIAGNOSIS — J0100 Acute maxillary sinusitis, unspecified: Secondary | ICD-10-CM | POA: Diagnosis not present

## 2017-08-31 DIAGNOSIS — J101 Influenza due to other identified influenza virus with other respiratory manifestations: Secondary | ICD-10-CM | POA: Diagnosis not present

## 2017-08-31 DIAGNOSIS — R6889 Other general symptoms and signs: Secondary | ICD-10-CM | POA: Diagnosis not present

## 2017-09-12 DIAGNOSIS — H65191 Other acute nonsuppurative otitis media, right ear: Secondary | ICD-10-CM | POA: Diagnosis not present

## 2017-10-05 DIAGNOSIS — Z23 Encounter for immunization: Secondary | ICD-10-CM | POA: Diagnosis not present

## 2017-10-05 DIAGNOSIS — F5101 Primary insomnia: Secondary | ICD-10-CM | POA: Diagnosis not present

## 2017-10-05 DIAGNOSIS — E7800 Pure hypercholesterolemia, unspecified: Secondary | ICD-10-CM | POA: Diagnosis not present

## 2017-10-05 DIAGNOSIS — Z0000 Encounter for general adult medical examination without abnormal findings: Secondary | ICD-10-CM | POA: Diagnosis not present

## 2017-10-05 DIAGNOSIS — I10 Essential (primary) hypertension: Secondary | ICD-10-CM | POA: Diagnosis not present

## 2017-10-05 DIAGNOSIS — H66001 Acute suppurative otitis media without spontaneous rupture of ear drum, right ear: Secondary | ICD-10-CM | POA: Diagnosis not present

## 2017-10-05 DIAGNOSIS — Z125 Encounter for screening for malignant neoplasm of prostate: Secondary | ICD-10-CM | POA: Diagnosis not present

## 2017-10-05 DIAGNOSIS — R7303 Prediabetes: Secondary | ICD-10-CM | POA: Diagnosis not present

## 2017-11-14 DIAGNOSIS — Z83511 Family history of glaucoma: Secondary | ICD-10-CM | POA: Diagnosis not present

## 2017-11-14 DIAGNOSIS — H40053 Ocular hypertension, bilateral: Secondary | ICD-10-CM | POA: Diagnosis not present

## 2017-11-14 DIAGNOSIS — H40013 Open angle with borderline findings, low risk, bilateral: Secondary | ICD-10-CM | POA: Diagnosis not present

## 2018-01-10 DIAGNOSIS — M79672 Pain in left foot: Secondary | ICD-10-CM | POA: Diagnosis not present

## 2018-01-10 DIAGNOSIS — M7662 Achilles tendinitis, left leg: Secondary | ICD-10-CM | POA: Diagnosis not present

## 2018-04-11 DIAGNOSIS — R7303 Prediabetes: Secondary | ICD-10-CM | POA: Diagnosis not present

## 2018-04-11 DIAGNOSIS — F5101 Primary insomnia: Secondary | ICD-10-CM | POA: Diagnosis not present

## 2018-04-11 DIAGNOSIS — I10 Essential (primary) hypertension: Secondary | ICD-10-CM | POA: Diagnosis not present

## 2018-04-11 DIAGNOSIS — E7800 Pure hypercholesterolemia, unspecified: Secondary | ICD-10-CM | POA: Diagnosis not present

## 2018-04-11 DIAGNOSIS — R946 Abnormal results of thyroid function studies: Secondary | ICD-10-CM | POA: Diagnosis not present

## 2018-04-28 DIAGNOSIS — Z23 Encounter for immunization: Secondary | ICD-10-CM | POA: Diagnosis not present

## 2018-05-22 DIAGNOSIS — H40053 Ocular hypertension, bilateral: Secondary | ICD-10-CM | POA: Diagnosis not present

## 2018-05-22 DIAGNOSIS — H2513 Age-related nuclear cataract, bilateral: Secondary | ICD-10-CM | POA: Diagnosis not present

## 2018-05-22 DIAGNOSIS — Z83511 Family history of glaucoma: Secondary | ICD-10-CM | POA: Diagnosis not present

## 2018-05-22 DIAGNOSIS — H40013 Open angle with borderline findings, low risk, bilateral: Secondary | ICD-10-CM | POA: Diagnosis not present

## 2018-05-22 DIAGNOSIS — H25013 Cortical age-related cataract, bilateral: Secondary | ICD-10-CM | POA: Diagnosis not present

## 2018-05-25 DIAGNOSIS — M7662 Achilles tendinitis, left leg: Secondary | ICD-10-CM | POA: Diagnosis not present

## 2018-05-25 DIAGNOSIS — R2689 Other abnormalities of gait and mobility: Secondary | ICD-10-CM | POA: Diagnosis not present

## 2018-05-25 DIAGNOSIS — M25572 Pain in left ankle and joints of left foot: Secondary | ICD-10-CM | POA: Diagnosis not present

## 2018-05-29 DIAGNOSIS — M7662 Achilles tendinitis, left leg: Secondary | ICD-10-CM | POA: Diagnosis not present

## 2018-05-29 DIAGNOSIS — M25572 Pain in left ankle and joints of left foot: Secondary | ICD-10-CM | POA: Diagnosis not present

## 2018-05-29 DIAGNOSIS — R2689 Other abnormalities of gait and mobility: Secondary | ICD-10-CM | POA: Diagnosis not present

## 2018-06-02 DIAGNOSIS — R2689 Other abnormalities of gait and mobility: Secondary | ICD-10-CM | POA: Diagnosis not present

## 2018-06-02 DIAGNOSIS — M25572 Pain in left ankle and joints of left foot: Secondary | ICD-10-CM | POA: Diagnosis not present

## 2018-06-02 DIAGNOSIS — M7662 Achilles tendinitis, left leg: Secondary | ICD-10-CM | POA: Diagnosis not present

## 2018-06-05 DIAGNOSIS — M25572 Pain in left ankle and joints of left foot: Secondary | ICD-10-CM | POA: Diagnosis not present

## 2018-06-05 DIAGNOSIS — R2689 Other abnormalities of gait and mobility: Secondary | ICD-10-CM | POA: Diagnosis not present

## 2018-06-05 DIAGNOSIS — M7662 Achilles tendinitis, left leg: Secondary | ICD-10-CM | POA: Diagnosis not present

## 2018-06-09 DIAGNOSIS — M25572 Pain in left ankle and joints of left foot: Secondary | ICD-10-CM | POA: Diagnosis not present

## 2018-06-09 DIAGNOSIS — M7662 Achilles tendinitis, left leg: Secondary | ICD-10-CM | POA: Diagnosis not present

## 2018-06-09 DIAGNOSIS — R2689 Other abnormalities of gait and mobility: Secondary | ICD-10-CM | POA: Diagnosis not present

## 2018-06-12 DIAGNOSIS — M25572 Pain in left ankle and joints of left foot: Secondary | ICD-10-CM | POA: Diagnosis not present

## 2018-06-12 DIAGNOSIS — R2689 Other abnormalities of gait and mobility: Secondary | ICD-10-CM | POA: Diagnosis not present

## 2018-06-12 DIAGNOSIS — M7662 Achilles tendinitis, left leg: Secondary | ICD-10-CM | POA: Diagnosis not present

## 2018-06-20 DIAGNOSIS — R2689 Other abnormalities of gait and mobility: Secondary | ICD-10-CM | POA: Diagnosis not present

## 2018-06-20 DIAGNOSIS — M7662 Achilles tendinitis, left leg: Secondary | ICD-10-CM | POA: Diagnosis not present

## 2018-06-20 DIAGNOSIS — M25572 Pain in left ankle and joints of left foot: Secondary | ICD-10-CM | POA: Diagnosis not present

## 2018-06-22 DIAGNOSIS — M25572 Pain in left ankle and joints of left foot: Secondary | ICD-10-CM | POA: Diagnosis not present

## 2018-06-22 DIAGNOSIS — M7662 Achilles tendinitis, left leg: Secondary | ICD-10-CM | POA: Diagnosis not present

## 2018-06-22 DIAGNOSIS — R2689 Other abnormalities of gait and mobility: Secondary | ICD-10-CM | POA: Diagnosis not present

## 2018-09-13 DIAGNOSIS — R7303 Prediabetes: Secondary | ICD-10-CM | POA: Diagnosis not present

## 2018-09-13 DIAGNOSIS — E7800 Pure hypercholesterolemia, unspecified: Secondary | ICD-10-CM | POA: Diagnosis not present

## 2018-09-13 DIAGNOSIS — Z125 Encounter for screening for malignant neoplasm of prostate: Secondary | ICD-10-CM | POA: Diagnosis not present

## 2018-09-13 DIAGNOSIS — I10 Essential (primary) hypertension: Secondary | ICD-10-CM | POA: Diagnosis not present

## 2018-10-11 DIAGNOSIS — F5101 Primary insomnia: Secondary | ICD-10-CM | POA: Diagnosis not present

## 2018-10-11 DIAGNOSIS — R7303 Prediabetes: Secondary | ICD-10-CM | POA: Diagnosis not present

## 2018-10-11 DIAGNOSIS — Z0000 Encounter for general adult medical examination without abnormal findings: Secondary | ICD-10-CM | POA: Diagnosis not present

## 2018-10-11 DIAGNOSIS — N183 Chronic kidney disease, stage 3 (moderate): Secondary | ICD-10-CM | POA: Diagnosis not present

## 2018-10-11 DIAGNOSIS — E039 Hypothyroidism, unspecified: Secondary | ICD-10-CM | POA: Diagnosis not present

## 2018-10-11 DIAGNOSIS — I10 Essential (primary) hypertension: Secondary | ICD-10-CM | POA: Diagnosis not present

## 2018-10-11 DIAGNOSIS — E7800 Pure hypercholesterolemia, unspecified: Secondary | ICD-10-CM | POA: Diagnosis not present

## 2018-12-19 DIAGNOSIS — R946 Abnormal results of thyroid function studies: Secondary | ICD-10-CM | POA: Diagnosis not present

## 2019-02-05 DIAGNOSIS — H40013 Open angle with borderline findings, low risk, bilateral: Secondary | ICD-10-CM | POA: Diagnosis not present

## 2019-04-24 DIAGNOSIS — Z23 Encounter for immunization: Secondary | ICD-10-CM | POA: Diagnosis not present

## 2019-06-04 DIAGNOSIS — H40013 Open angle with borderline findings, low risk, bilateral: Secondary | ICD-10-CM | POA: Diagnosis not present

## 2019-08-18 ENCOUNTER — Ambulatory Visit: Payer: PRIVATE HEALTH INSURANCE

## 2019-08-18 DIAGNOSIS — Z20828 Contact with and (suspected) exposure to other viral communicable diseases: Secondary | ICD-10-CM | POA: Diagnosis not present

## 2019-08-23 ENCOUNTER — Ambulatory Visit: Payer: PRIVATE HEALTH INSURANCE

## 2019-08-26 ENCOUNTER — Ambulatory Visit: Payer: PRIVATE HEALTH INSURANCE | Attending: Internal Medicine

## 2019-08-26 DIAGNOSIS — Z23 Encounter for immunization: Secondary | ICD-10-CM

## 2019-08-26 NOTE — Progress Notes (Signed)
   Covid-19 Vaccination Clinic  Name:  KHYSEN LUHRS    MRN: [id number] DOB: 04-30-1951  08/26/2019  Mr. Yakubov was observed post Covid-19 immunization for 30 minutes based on pre-vaccination screening without incidence. He was provided with Vaccine Information Sheet and instruction to access the V-Safe system.   Mr. Guiffre was instructed to call 911 with any severe reactions post vaccine: Marland Kitchen Difficulty breathing  . Swelling of your face and throat  . A fast heartbeat  . A bad rash all over your body  . Dizziness and weakness    Immunizations Administered    Name Date Dose VIS Date Route   Pfizer COVID-19 Vaccine 08/26/2019  8:53 AM 0.3 mL 06/29/2019 Intramuscular   Manufacturer: [place]   Lot: [id number]   [place]: [id number]

## 2019-09-19 ENCOUNTER — Ambulatory Visit: Payer: PRIVATE HEALTH INSURANCE | Attending: Internal Medicine

## 2019-09-19 DIAGNOSIS — Z23 Encounter for immunization: Secondary | ICD-10-CM

## 2019-09-19 NOTE — Progress Notes (Signed)
   Covid-19 Vaccination Clinic  Name:  Cory Reid    MRN: [id number] DOB: 1951-07-13  09/19/2019  [name] was observed post Covid-19 immunization for 15 minutes without incident. He was provided with Vaccine Information Sheet and instruction to access the V-Safe system.   [name] was instructed to call 911 with any severe reactions post vaccine: Marland Kitchen Difficulty breathing  . Swelling of face and throat  . A fast heartbeat  . A bad rash all over body  . Dizziness and weakness   Immunizations Administered    Name Date Dose VIS Date Route   Pfizer COVID-19 Vaccine 09/19/2019  3:53 PM 0.3 mL 06/29/2019 Intramuscular   Manufacturer: [place]   Lot: [id number]   [place]: [id number]

## 2019-09-26 DIAGNOSIS — E039 Hypothyroidism, unspecified: Secondary | ICD-10-CM | POA: Diagnosis not present

## 2019-09-26 DIAGNOSIS — E7800 Pure hypercholesterolemia, unspecified: Secondary | ICD-10-CM | POA: Diagnosis not present

## 2019-09-26 DIAGNOSIS — I10 Essential (primary) hypertension: Secondary | ICD-10-CM | POA: Diagnosis not present

## 2019-09-26 DIAGNOSIS — N189 Chronic kidney disease, unspecified: Secondary | ICD-10-CM | POA: Diagnosis not present

## 2019-10-29 DIAGNOSIS — E7800 Pure hypercholesterolemia, unspecified: Secondary | ICD-10-CM | POA: Diagnosis not present

## 2019-10-29 DIAGNOSIS — E039 Hypothyroidism, unspecified: Secondary | ICD-10-CM | POA: Diagnosis not present

## 2019-10-29 DIAGNOSIS — R7303 Prediabetes: Secondary | ICD-10-CM | POA: Diagnosis not present

## 2019-10-29 DIAGNOSIS — I10 Essential (primary) hypertension: Secondary | ICD-10-CM | POA: Diagnosis not present

## 2019-10-29 DIAGNOSIS — F5101 Primary insomnia: Secondary | ICD-10-CM | POA: Diagnosis not present

## 2019-10-29 DIAGNOSIS — Z125 Encounter for screening for malignant neoplasm of prostate: Secondary | ICD-10-CM | POA: Diagnosis not present

## 2019-10-29 DIAGNOSIS — Z0000 Encounter for general adult medical examination without abnormal findings: Secondary | ICD-10-CM | POA: Diagnosis not present

## 2019-11-12 DIAGNOSIS — N189 Chronic kidney disease, unspecified: Secondary | ICD-10-CM | POA: Diagnosis not present

## 2019-11-12 DIAGNOSIS — E7800 Pure hypercholesterolemia, unspecified: Secondary | ICD-10-CM | POA: Diagnosis not present

## 2019-11-12 DIAGNOSIS — I10 Essential (primary) hypertension: Secondary | ICD-10-CM | POA: Diagnosis not present

## 2019-11-12 DIAGNOSIS — E039 Hypothyroidism, unspecified: Secondary | ICD-10-CM | POA: Diagnosis not present

## 2019-12-03 DIAGNOSIS — H40023 Open angle with borderline findings, high risk, bilateral: Secondary | ICD-10-CM | POA: Diagnosis not present

## 2020-01-16 DIAGNOSIS — N189 Chronic kidney disease, unspecified: Secondary | ICD-10-CM | POA: Diagnosis not present

## 2020-01-16 DIAGNOSIS — I10 Essential (primary) hypertension: Secondary | ICD-10-CM | POA: Diagnosis not present

## 2020-01-16 DIAGNOSIS — E039 Hypothyroidism, unspecified: Secondary | ICD-10-CM | POA: Diagnosis not present

## 2020-01-16 DIAGNOSIS — E7800 Pure hypercholesterolemia, unspecified: Secondary | ICD-10-CM | POA: Diagnosis not present

## 2020-01-18 DIAGNOSIS — H40053 Ocular hypertension, bilateral: Secondary | ICD-10-CM | POA: Diagnosis not present

## 2020-01-18 DIAGNOSIS — H25013 Cortical age-related cataract, bilateral: Secondary | ICD-10-CM | POA: Diagnosis not present

## 2020-01-18 DIAGNOSIS — H2512 Age-related nuclear cataract, left eye: Secondary | ICD-10-CM | POA: Diagnosis not present

## 2020-01-18 DIAGNOSIS — H2513 Age-related nuclear cataract, bilateral: Secondary | ICD-10-CM | POA: Diagnosis not present

## 2020-01-18 DIAGNOSIS — H40013 Open angle with borderline findings, low risk, bilateral: Secondary | ICD-10-CM | POA: Diagnosis not present

## 2020-01-18 DIAGNOSIS — H25043 Posterior subcapsular polar age-related cataract, bilateral: Secondary | ICD-10-CM | POA: Diagnosis not present

## 2020-02-14 DIAGNOSIS — E7800 Pure hypercholesterolemia, unspecified: Secondary | ICD-10-CM | POA: Diagnosis not present

## 2020-02-14 DIAGNOSIS — I10 Essential (primary) hypertension: Secondary | ICD-10-CM | POA: Diagnosis not present

## 2020-02-14 DIAGNOSIS — E039 Hypothyroidism, unspecified: Secondary | ICD-10-CM | POA: Diagnosis not present

## 2020-02-14 DIAGNOSIS — N189 Chronic kidney disease, unspecified: Secondary | ICD-10-CM | POA: Diagnosis not present

## 2020-02-21 DIAGNOSIS — H2512 Age-related nuclear cataract, left eye: Secondary | ICD-10-CM | POA: Diagnosis not present

## 2020-02-22 DIAGNOSIS — H2511 Age-related nuclear cataract, right eye: Secondary | ICD-10-CM | POA: Diagnosis not present

## 2020-03-06 DIAGNOSIS — H2511 Age-related nuclear cataract, right eye: Secondary | ICD-10-CM | POA: Diagnosis not present

## 2020-03-14 DIAGNOSIS — E039 Hypothyroidism, unspecified: Secondary | ICD-10-CM | POA: Diagnosis not present

## 2020-03-14 DIAGNOSIS — I10 Essential (primary) hypertension: Secondary | ICD-10-CM | POA: Diagnosis not present

## 2020-03-14 DIAGNOSIS — N189 Chronic kidney disease, unspecified: Secondary | ICD-10-CM | POA: Diagnosis not present

## 2020-03-14 DIAGNOSIS — E7800 Pure hypercholesterolemia, unspecified: Secondary | ICD-10-CM | POA: Diagnosis not present

## 2020-04-09 DIAGNOSIS — N189 Chronic kidney disease, unspecified: Secondary | ICD-10-CM | POA: Diagnosis not present

## 2020-04-09 DIAGNOSIS — I10 Essential (primary) hypertension: Secondary | ICD-10-CM | POA: Diagnosis not present

## 2020-04-09 DIAGNOSIS — E039 Hypothyroidism, unspecified: Secondary | ICD-10-CM | POA: Diagnosis not present

## 2020-04-09 DIAGNOSIS — E7800 Pure hypercholesterolemia, unspecified: Secondary | ICD-10-CM | POA: Diagnosis not present

## 2020-05-13 DIAGNOSIS — H40013 Open angle with borderline findings, low risk, bilateral: Secondary | ICD-10-CM | POA: Diagnosis not present

## 2020-05-15 DIAGNOSIS — E039 Hypothyroidism, unspecified: Secondary | ICD-10-CM | POA: Diagnosis not present

## 2020-05-15 DIAGNOSIS — Z23 Encounter for immunization: Secondary | ICD-10-CM | POA: Diagnosis not present

## 2020-05-15 DIAGNOSIS — E7800 Pure hypercholesterolemia, unspecified: Secondary | ICD-10-CM | POA: Diagnosis not present

## 2020-05-15 DIAGNOSIS — N189 Chronic kidney disease, unspecified: Secondary | ICD-10-CM | POA: Diagnosis not present

## 2020-05-15 DIAGNOSIS — I10 Essential (primary) hypertension: Secondary | ICD-10-CM | POA: Diagnosis not present

## 2020-05-28 DIAGNOSIS — I10 Essential (primary) hypertension: Secondary | ICD-10-CM | POA: Diagnosis not present

## 2020-05-28 DIAGNOSIS — D649 Anemia, unspecified: Secondary | ICD-10-CM | POA: Diagnosis not present

## 2020-05-28 DIAGNOSIS — E7800 Pure hypercholesterolemia, unspecified: Secondary | ICD-10-CM | POA: Diagnosis not present

## 2020-05-28 DIAGNOSIS — E039 Hypothyroidism, unspecified: Secondary | ICD-10-CM | POA: Diagnosis not present

## 2020-05-28 DIAGNOSIS — R7303 Prediabetes: Secondary | ICD-10-CM | POA: Diagnosis not present

## 2020-05-28 DIAGNOSIS — F5101 Primary insomnia: Secondary | ICD-10-CM | POA: Diagnosis not present

## 2020-05-28 DIAGNOSIS — R7309 Other abnormal glucose: Secondary | ICD-10-CM | POA: Diagnosis not present

## 2020-06-03 DIAGNOSIS — S9031XA Contusion of right foot, initial encounter: Secondary | ICD-10-CM | POA: Diagnosis not present

## 2020-06-11 DIAGNOSIS — I10 Essential (primary) hypertension: Secondary | ICD-10-CM | POA: Diagnosis not present

## 2020-06-11 DIAGNOSIS — N189 Chronic kidney disease, unspecified: Secondary | ICD-10-CM | POA: Diagnosis not present

## 2020-06-11 DIAGNOSIS — E039 Hypothyroidism, unspecified: Secondary | ICD-10-CM | POA: Diagnosis not present

## 2020-06-11 DIAGNOSIS — E7800 Pure hypercholesterolemia, unspecified: Secondary | ICD-10-CM | POA: Diagnosis not present

## 2020-06-26 DIAGNOSIS — M25511 Pain in right shoulder: Secondary | ICD-10-CM | POA: Diagnosis not present

## 2020-06-26 DIAGNOSIS — G8929 Other chronic pain: Secondary | ICD-10-CM | POA: Diagnosis not present

## 2020-06-27 DIAGNOSIS — M25511 Pain in right shoulder: Secondary | ICD-10-CM | POA: Diagnosis not present

## 2020-06-27 DIAGNOSIS — G8929 Other chronic pain: Secondary | ICD-10-CM | POA: Diagnosis not present

## 2020-06-27 DIAGNOSIS — M19011 Primary osteoarthritis, right shoulder: Secondary | ICD-10-CM | POA: Diagnosis not present

## 2020-06-27 DIAGNOSIS — R936 Abnormal findings on diagnostic imaging of limbs: Secondary | ICD-10-CM | POA: Diagnosis not present

## 2020-07-03 DIAGNOSIS — M19011 Primary osteoarthritis, right shoulder: Secondary | ICD-10-CM | POA: Diagnosis not present

## 2020-07-03 DIAGNOSIS — M25511 Pain in right shoulder: Secondary | ICD-10-CM | POA: Diagnosis not present

## 2020-07-03 DIAGNOSIS — M75111 Incomplete rotator cuff tear or rupture of right shoulder, not specified as traumatic: Secondary | ICD-10-CM | POA: Diagnosis not present

## 2020-07-03 DIAGNOSIS — G8929 Other chronic pain: Secondary | ICD-10-CM | POA: Diagnosis not present

## 2020-07-04 DIAGNOSIS — I10 Essential (primary) hypertension: Secondary | ICD-10-CM | POA: Diagnosis not present

## 2020-07-04 DIAGNOSIS — E7800 Pure hypercholesterolemia, unspecified: Secondary | ICD-10-CM | POA: Diagnosis not present

## 2020-07-04 DIAGNOSIS — E039 Hypothyroidism, unspecified: Secondary | ICD-10-CM | POA: Diagnosis not present

## 2020-07-04 DIAGNOSIS — N189 Chronic kidney disease, unspecified: Secondary | ICD-10-CM | POA: Diagnosis not present

## 2020-08-14 DIAGNOSIS — M25511 Pain in right shoulder: Secondary | ICD-10-CM | POA: Diagnosis not present

## 2020-08-14 DIAGNOSIS — M19011 Primary osteoarthritis, right shoulder: Secondary | ICD-10-CM | POA: Diagnosis not present

## 2020-08-14 DIAGNOSIS — G8929 Other chronic pain: Secondary | ICD-10-CM | POA: Diagnosis not present

## 2020-08-14 DIAGNOSIS — M75111 Incomplete rotator cuff tear or rupture of right shoulder, not specified as traumatic: Secondary | ICD-10-CM | POA: Diagnosis not present

## 2020-09-02 DIAGNOSIS — N189 Chronic kidney disease, unspecified: Secondary | ICD-10-CM | POA: Diagnosis not present

## 2020-09-02 DIAGNOSIS — E7800 Pure hypercholesterolemia, unspecified: Secondary | ICD-10-CM | POA: Diagnosis not present

## 2020-09-02 DIAGNOSIS — I10 Essential (primary) hypertension: Secondary | ICD-10-CM | POA: Diagnosis not present

## 2020-09-02 DIAGNOSIS — E039 Hypothyroidism, unspecified: Secondary | ICD-10-CM | POA: Diagnosis not present

## 2020-10-10 DIAGNOSIS — L821 Other seborrheic keratosis: Secondary | ICD-10-CM | POA: Diagnosis not present

## 2020-10-10 DIAGNOSIS — C44622 Squamous cell carcinoma of skin of right upper limb, including shoulder: Secondary | ICD-10-CM | POA: Diagnosis not present

## 2020-10-10 DIAGNOSIS — B351 Tinea unguium: Secondary | ICD-10-CM | POA: Diagnosis not present

## 2020-10-10 DIAGNOSIS — L578 Other skin changes due to chronic exposure to nonionizing radiation: Secondary | ICD-10-CM | POA: Diagnosis not present

## 2020-10-13 DIAGNOSIS — E7800 Pure hypercholesterolemia, unspecified: Secondary | ICD-10-CM | POA: Diagnosis not present

## 2020-10-13 DIAGNOSIS — I10 Essential (primary) hypertension: Secondary | ICD-10-CM | POA: Diagnosis not present

## 2020-10-13 DIAGNOSIS — N189 Chronic kidney disease, unspecified: Secondary | ICD-10-CM | POA: Diagnosis not present

## 2020-10-13 DIAGNOSIS — E039 Hypothyroidism, unspecified: Secondary | ICD-10-CM | POA: Diagnosis not present

## 2020-10-15 DIAGNOSIS — F419 Anxiety disorder, unspecified: Secondary | ICD-10-CM | POA: Diagnosis not present

## 2020-10-15 DIAGNOSIS — R0789 Other chest pain: Secondary | ICD-10-CM | POA: Diagnosis not present

## 2020-11-04 DIAGNOSIS — H40013 Open angle with borderline findings, low risk, bilateral: Secondary | ICD-10-CM | POA: Diagnosis not present

## 2020-11-05 DIAGNOSIS — Z125 Encounter for screening for malignant neoplasm of prostate: Secondary | ICD-10-CM | POA: Diagnosis not present

## 2020-11-05 DIAGNOSIS — E7800 Pure hypercholesterolemia, unspecified: Secondary | ICD-10-CM | POA: Diagnosis not present

## 2020-11-05 DIAGNOSIS — R7303 Prediabetes: Secondary | ICD-10-CM | POA: Diagnosis not present

## 2020-11-05 DIAGNOSIS — E039 Hypothyroidism, unspecified: Secondary | ICD-10-CM | POA: Diagnosis not present

## 2020-11-05 DIAGNOSIS — F5101 Primary insomnia: Secondary | ICD-10-CM | POA: Diagnosis not present

## 2020-11-05 DIAGNOSIS — Z0000 Encounter for general adult medical examination without abnormal findings: Secondary | ICD-10-CM | POA: Diagnosis not present

## 2020-11-05 DIAGNOSIS — I10 Essential (primary) hypertension: Secondary | ICD-10-CM | POA: Diagnosis not present

## 2020-12-03 DIAGNOSIS — I10 Essential (primary) hypertension: Secondary | ICD-10-CM | POA: Diagnosis not present

## 2020-12-03 DIAGNOSIS — N189 Chronic kidney disease, unspecified: Secondary | ICD-10-CM | POA: Diagnosis not present

## 2020-12-03 DIAGNOSIS — E039 Hypothyroidism, unspecified: Secondary | ICD-10-CM | POA: Diagnosis not present

## 2020-12-03 DIAGNOSIS — E7800 Pure hypercholesterolemia, unspecified: Secondary | ICD-10-CM | POA: Diagnosis not present

## 2021-01-05 DIAGNOSIS — L570 Actinic keratosis: Secondary | ICD-10-CM | POA: Diagnosis not present

## 2021-01-05 DIAGNOSIS — C44622 Squamous cell carcinoma of skin of right upper limb, including shoulder: Secondary | ICD-10-CM | POA: Diagnosis not present

## 2021-01-14 DIAGNOSIS — E7800 Pure hypercholesterolemia, unspecified: Secondary | ICD-10-CM | POA: Diagnosis not present

## 2021-01-14 DIAGNOSIS — E039 Hypothyroidism, unspecified: Secondary | ICD-10-CM | POA: Diagnosis not present

## 2021-01-14 DIAGNOSIS — N189 Chronic kidney disease, unspecified: Secondary | ICD-10-CM | POA: Diagnosis not present

## 2021-01-14 DIAGNOSIS — I10 Essential (primary) hypertension: Secondary | ICD-10-CM | POA: Diagnosis not present

## 2021-03-16 DIAGNOSIS — E039 Hypothyroidism, unspecified: Secondary | ICD-10-CM | POA: Diagnosis not present

## 2021-03-16 DIAGNOSIS — I10 Essential (primary) hypertension: Secondary | ICD-10-CM | POA: Diagnosis not present

## 2021-03-16 DIAGNOSIS — N189 Chronic kidney disease, unspecified: Secondary | ICD-10-CM | POA: Diagnosis not present

## 2021-03-16 DIAGNOSIS — E7800 Pure hypercholesterolemia, unspecified: Secondary | ICD-10-CM | POA: Diagnosis not present

## 2021-05-01 DIAGNOSIS — E039 Hypothyroidism, unspecified: Secondary | ICD-10-CM | POA: Diagnosis not present

## 2021-05-01 DIAGNOSIS — N189 Chronic kidney disease, unspecified: Secondary | ICD-10-CM | POA: Diagnosis not present

## 2021-05-01 DIAGNOSIS — E7800 Pure hypercholesterolemia, unspecified: Secondary | ICD-10-CM | POA: Diagnosis not present

## 2021-05-01 DIAGNOSIS — I10 Essential (primary) hypertension: Secondary | ICD-10-CM | POA: Diagnosis not present

## 2021-05-11 DIAGNOSIS — E039 Hypothyroidism, unspecified: Secondary | ICD-10-CM | POA: Diagnosis not present

## 2021-05-11 DIAGNOSIS — R7303 Prediabetes: Secondary | ICD-10-CM | POA: Diagnosis not present

## 2021-05-11 DIAGNOSIS — I10 Essential (primary) hypertension: Secondary | ICD-10-CM | POA: Diagnosis not present

## 2021-05-11 DIAGNOSIS — F419 Anxiety disorder, unspecified: Secondary | ICD-10-CM | POA: Diagnosis not present

## 2021-05-11 DIAGNOSIS — N189 Chronic kidney disease, unspecified: Secondary | ICD-10-CM | POA: Diagnosis not present

## 2021-05-11 DIAGNOSIS — R7309 Other abnormal glucose: Secondary | ICD-10-CM | POA: Diagnosis not present

## 2021-05-11 DIAGNOSIS — E7800 Pure hypercholesterolemia, unspecified: Secondary | ICD-10-CM | POA: Diagnosis not present

## 2021-05-11 DIAGNOSIS — Z23 Encounter for immunization: Secondary | ICD-10-CM | POA: Diagnosis not present

## 2021-05-11 DIAGNOSIS — F5101 Primary insomnia: Secondary | ICD-10-CM | POA: Diagnosis not present

## 2021-05-28 DIAGNOSIS — E7800 Pure hypercholesterolemia, unspecified: Secondary | ICD-10-CM | POA: Diagnosis not present

## 2021-05-28 DIAGNOSIS — N189 Chronic kidney disease, unspecified: Secondary | ICD-10-CM | POA: Diagnosis not present

## 2021-05-28 DIAGNOSIS — E039 Hypothyroidism, unspecified: Secondary | ICD-10-CM | POA: Diagnosis not present

## 2021-05-28 DIAGNOSIS — I10 Essential (primary) hypertension: Secondary | ICD-10-CM | POA: Diagnosis not present

## 2021-06-22 DIAGNOSIS — J019 Acute sinusitis, unspecified: Secondary | ICD-10-CM | POA: Diagnosis not present

## 2021-07-07 DIAGNOSIS — C44622 Squamous cell carcinoma of skin of right upper limb, including shoulder: Secondary | ICD-10-CM | POA: Diagnosis not present

## 2021-07-07 DIAGNOSIS — L578 Other skin changes due to chronic exposure to nonionizing radiation: Secondary | ICD-10-CM | POA: Diagnosis not present

## 2021-07-07 DIAGNOSIS — L570 Actinic keratosis: Secondary | ICD-10-CM | POA: Diagnosis not present

## 2021-07-07 DIAGNOSIS — L821 Other seborrheic keratosis: Secondary | ICD-10-CM | POA: Diagnosis not present

## 2021-07-23 DIAGNOSIS — L570 Actinic keratosis: Secondary | ICD-10-CM | POA: Diagnosis not present

## 2021-08-24 DIAGNOSIS — I10 Essential (primary) hypertension: Secondary | ICD-10-CM | POA: Diagnosis not present

## 2021-08-24 DIAGNOSIS — E7800 Pure hypercholesterolemia, unspecified: Secondary | ICD-10-CM | POA: Diagnosis not present

## 2021-08-24 DIAGNOSIS — E039 Hypothyroidism, unspecified: Secondary | ICD-10-CM | POA: Diagnosis not present

## 2021-10-30 DIAGNOSIS — H40013 Open angle with borderline findings, low risk, bilateral: Secondary | ICD-10-CM | POA: Diagnosis not present

## 2021-11-16 DIAGNOSIS — R7303 Prediabetes: Secondary | ICD-10-CM | POA: Diagnosis not present

## 2021-11-16 DIAGNOSIS — N189 Chronic kidney disease, unspecified: Secondary | ICD-10-CM | POA: Diagnosis not present

## 2021-11-16 DIAGNOSIS — E039 Hypothyroidism, unspecified: Secondary | ICD-10-CM | POA: Diagnosis not present

## 2021-11-16 DIAGNOSIS — Z0000 Encounter for general adult medical examination without abnormal findings: Secondary | ICD-10-CM | POA: Diagnosis not present

## 2021-11-16 DIAGNOSIS — E7800 Pure hypercholesterolemia, unspecified: Secondary | ICD-10-CM | POA: Diagnosis not present

## 2021-11-16 DIAGNOSIS — I10 Essential (primary) hypertension: Secondary | ICD-10-CM | POA: Diagnosis not present

## 2021-11-16 DIAGNOSIS — F5101 Primary insomnia: Secondary | ICD-10-CM | POA: Diagnosis not present

## 2021-11-16 DIAGNOSIS — Z125 Encounter for screening for malignant neoplasm of prostate: Secondary | ICD-10-CM | POA: Diagnosis not present

## 2022-02-24 DIAGNOSIS — H18413 Arcus senilis, bilateral: Secondary | ICD-10-CM | POA: Diagnosis not present

## 2022-02-24 DIAGNOSIS — H26493 Other secondary cataract, bilateral: Secondary | ICD-10-CM | POA: Diagnosis not present

## 2022-02-24 DIAGNOSIS — H43392 Other vitreous opacities, left eye: Secondary | ICD-10-CM | POA: Diagnosis not present

## 2022-02-24 DIAGNOSIS — H40013 Open angle with borderline findings, low risk, bilateral: Secondary | ICD-10-CM | POA: Diagnosis not present

## 2022-03-18 DIAGNOSIS — J0100 Acute maxillary sinusitis, unspecified: Secondary | ICD-10-CM | POA: Diagnosis not present

## 2022-03-18 DIAGNOSIS — H6501 Acute serous otitis media, right ear: Secondary | ICD-10-CM | POA: Diagnosis not present

## 2022-05-03 DIAGNOSIS — H6591 Unspecified nonsuppurative otitis media, right ear: Secondary | ICD-10-CM | POA: Diagnosis not present

## 2022-05-03 DIAGNOSIS — Z23 Encounter for immunization: Secondary | ICD-10-CM | POA: Diagnosis not present

## 2022-05-12 DIAGNOSIS — S46312A Strain of muscle, fascia and tendon of triceps, left arm, initial encounter: Secondary | ICD-10-CM | POA: Diagnosis not present

## 2022-05-12 DIAGNOSIS — M7022 Olecranon bursitis, left elbow: Secondary | ICD-10-CM | POA: Diagnosis not present

## 2022-05-19 DIAGNOSIS — M25522 Pain in left elbow: Secondary | ICD-10-CM | POA: Diagnosis not present

## 2022-05-19 DIAGNOSIS — M7022 Olecranon bursitis, left elbow: Secondary | ICD-10-CM | POA: Diagnosis not present

## 2022-05-19 DIAGNOSIS — S46312A Strain of muscle, fascia and tendon of triceps, left arm, initial encounter: Secondary | ICD-10-CM | POA: Diagnosis not present

## 2022-06-01 DIAGNOSIS — M7022 Olecranon bursitis, left elbow: Secondary | ICD-10-CM | POA: Diagnosis not present

## 2022-06-03 DIAGNOSIS — E039 Hypothyroidism, unspecified: Secondary | ICD-10-CM | POA: Diagnosis not present

## 2022-06-03 DIAGNOSIS — I10 Essential (primary) hypertension: Secondary | ICD-10-CM | POA: Diagnosis not present

## 2022-06-03 DIAGNOSIS — E7800 Pure hypercholesterolemia, unspecified: Secondary | ICD-10-CM | POA: Diagnosis not present

## 2022-06-03 DIAGNOSIS — F5101 Primary insomnia: Secondary | ICD-10-CM | POA: Diagnosis not present

## 2022-06-03 DIAGNOSIS — R7303 Prediabetes: Secondary | ICD-10-CM | POA: Diagnosis not present

## 2022-06-08 DIAGNOSIS — H40013 Open angle with borderline findings, low risk, bilateral: Secondary | ICD-10-CM | POA: Diagnosis not present

## 2022-06-09 DIAGNOSIS — M7712 Lateral epicondylitis, left elbow: Secondary | ICD-10-CM | POA: Diagnosis not present

## 2022-06-09 DIAGNOSIS — M7022 Olecranon bursitis, left elbow: Secondary | ICD-10-CM | POA: Diagnosis not present

## 2022-06-09 DIAGNOSIS — S46312A Strain of muscle, fascia and tendon of triceps, left arm, initial encounter: Secondary | ICD-10-CM | POA: Diagnosis not present

## 2022-06-21 DIAGNOSIS — L578 Other skin changes due to chronic exposure to nonionizing radiation: Secondary | ICD-10-CM | POA: Diagnosis not present

## 2022-06-21 DIAGNOSIS — L570 Actinic keratosis: Secondary | ICD-10-CM | POA: Diagnosis not present

## 2022-06-21 DIAGNOSIS — L700 Acne vulgaris: Secondary | ICD-10-CM | POA: Diagnosis not present

## 2022-06-21 DIAGNOSIS — L821 Other seborrheic keratosis: Secondary | ICD-10-CM | POA: Diagnosis not present

## 2022-09-01 DIAGNOSIS — R0789 Other chest pain: Secondary | ICD-10-CM | POA: Diagnosis not present

## 2022-09-01 DIAGNOSIS — I10 Essential (primary) hypertension: Secondary | ICD-10-CM | POA: Diagnosis not present

## 2022-09-13 ENCOUNTER — Ambulatory Visit: Payer: PPO | Admitting: Internal Medicine

## 2022-09-13 ENCOUNTER — Encounter: Payer: Self-pay | Admitting: Internal Medicine

## 2022-09-13 VITALS — BP 155/89 | HR 61 | Ht 69.0 in | Wt 177.6 lb

## 2022-09-13 DIAGNOSIS — R079 Chest pain, unspecified: Secondary | ICD-10-CM

## 2022-09-13 DIAGNOSIS — I209 Angina pectoris, unspecified: Secondary | ICD-10-CM | POA: Diagnosis not present

## 2022-09-13 DIAGNOSIS — E782 Mixed hyperlipidemia: Secondary | ICD-10-CM | POA: Diagnosis not present

## 2022-09-13 DIAGNOSIS — I10 Essential (primary) hypertension: Secondary | ICD-10-CM | POA: Diagnosis not present

## 2022-09-13 MED ORDER — LISINOPRIL 10 MG PO TABS: 10.0000 mg | ORAL_TABLET | Freq: Every day | ORAL | 3 refills | Status: DC

## 2022-09-13 NOTE — Progress Notes (Signed)
Primary Physician/Referring:  Shirline Frees, MD  Patient ID: Cory Reid, male    DOB: 1950/12/24, 72 y.o.   MRN: [id number]  Chief Complaint  Patient presents with   Chest Pain   New Patient (Initial Visit)   Hypertension   HPI:    Cory Reid  is a 72 y.o. male with hypertension and hyperlipidemia who is here to establish care with cardiology due to chest pain. He was at the beach a few weeks ago and the entire week he was there he experienced chest pain and tightness. He knew his BP was elevated and he was checking it daily and his PCP instructed him to double up on his lisinopril. This worked very well but then his BP was quite low after about a week on the 10 mg dose of lisinopril. He is taking 5 mg every night now and knows to take 10 mg if his pressure is elevated like it was at the beach. He noticed chest tightness playing softball during that trip and this has never happened to him before. He plays softball for two different teams and he is very active. His father had an MI and recently his daughter who is in her 39s had a heart attack requiring 4 stents to her coronaries. Patient denies cardiac history in himself but he is quite worried after his episodes at the beach. He denies shortness of breath, palpitations, diaphoresis, syncope, edema, orthopnea, PND, claudication.  Past Medical History:  Diagnosis Date   High cholesterol    Hypertension    Insomnia    Kidney stone    Past Surgical History:  Procedure Laterality Date   ANKLE SURGERY     SHOULDER SURGERY  1975   Family History  Problem Relation Age of Onset   Hypertension Father    Heart disease Father    Heart attack Father    Diabetes Father     Social History   Tobacco Use   Smoking status: Never   Smokeless tobacco: Never  Substance Use Topics   Alcohol use: No   Marital Status: Married  ROS  Review of Systems  Cardiovascular:  Positive for chest pain.   Objective  Blood pressure (!)  155/89, pulse 61, height '5\' 9"'$  (1.753 m), weight 177 lb 9.6 oz (80.6 kg), SpO2 98 %. Body mass index is 26.23 kg/m.     09/13/2022   10:16 AM 09/13/2022   10:05 AM 04/27/2013    1:42 PM  Vitals with BMI  Height  '5\' 9"'$    Weight  177 lbs 10 oz   BMI  [id number]   Systolic [id number]  Diastolic 89 89 73  Pulse 61 65 86     Physical Exam Vitals reviewed.  HENT:     Head: Normocephalic and atraumatic.  Cardiovascular:     Rate and Rhythm: Normal rate and regular rhythm.     Pulses: Normal pulses.     Heart sounds: Normal heart sounds. No murmur heard. Pulmonary:     Effort: Pulmonary effort is normal.     Breath sounds: Normal breath sounds.  Abdominal:     General: Bowel sounds are normal.  Musculoskeletal:     Right lower leg: No edema.     Left lower leg: No edema.  Skin:    General: Skin is warm and dry.  Neurological:     Mental Status: He is alert.     Medications and allergies   Allergies  Allergen Reactions  Penicillins Swelling     Medication list after today's encounter   Current Outpatient Medications:    atorvastatin (LIPITOR) 10 MG tablet, Take 10 mg by mouth at bedtime., Disp: , Rfl:    Coenzyme Q-10 200 MG CAPS, Take 1 capsule by mouth daily., Disp: , Rfl:    levothyroxine (SYNTHROID) 25 MCG tablet, Take 25 mcg by mouth daily., Disp: , Rfl:    lisinopril (ZESTRIL) 10 MG tablet, Take 1 tablet (10 mg total) by mouth daily., Disp: 90 tablet, Rfl: 3   Multiple Vitamin (MULTIVITAMIN WITH MINERALS) TABS tablet, Take 1 tablet by mouth 2 (two) times daily., Disp: , Rfl:    Omega-3 Fatty Acids (FISH OIL) 1000 MG CAPS, Take by mouth., Disp: , Rfl:    zolpidem (AMBIEN) 10 MG tablet, Take 5 mg by mouth at bedtime as needed for sleep., Disp: , Rfl:   Laboratory examination:   Lab Results  Component Value Date   NA 136 04/27/2013   K 4.0 04/27/2013   CO2 25 04/27/2013   GLUCOSE 104 (H) 04/27/2013   BUN 25 (H) 04/27/2013   CREATININE 1.23 04/27/2013   CALCIUM  9.3 04/27/2013   GFRNONAA 61 (L) 04/27/2013       Latest Ref Rng & Units 04/27/2013    8:50 AM  CMP  Glucose 70 - 99 mg/dL 104   BUN 6 - 23 mg/dL 25   Creatinine 0.50 - 1.35 mg/dL 1.23   Sodium 135 - 145 mEq/L 136   Potassium 3.5 - 5.1 mEq/L 4.0   Chloride 96 - 112 mEq/L 102   CO2 19 - 32 mEq/L 25   Calcium 8.4 - 10.5 mg/dL 9.3   Total Protein 6.0 - 8.3 g/dL 8.7   Total Bilirubin 0.3 - 1.2 mg/dL 0.3   Alkaline Phos 39 - 117 U/L 55   AST 0 - 37 U/L 25   ALT 0 - 53 U/L 36       Latest Ref Rng & Units 04/27/2013    8:50 AM 07/21/2006   10:42 AM  CBC  WBC 4.0 - 10.5 K/uL 9.0  4.5   Hemoglobin 13.0 - 17.0 g/dL 12.5  12.7   Hematocrit 39.0 - 52.0 % 37.2  38.4   Platelets 150 - 400 K/uL 257  282     Lipid Panel No results for input(s): "CHOL", "TRIG", "LDLCALC", "VLDL", "HDL", "CHOLHDL", "LDLDIRECT" in the last 8760 hours.  HEMOGLOBIN A1C No results found for: "HGBA1C", "MPG" TSH No results for input(s): "TSH" in the last 8760 hours.  External labs:  Labs 06/03/2022: hgbA1c = 6.3 Cholesterol 175 HDL 47 triglycerides 141 LDL 103 BUN 19 Cr 1.10 HGB 12.2 HCT 35.8 PLT 246   Radiology:    Cardiac Studies:     EKG:   09/13/2022: Sinus Bradycardia, rate 58 bpm. Normal axis, no ischemia.  Assessment     ICD-10-CM   1. Chest pain of uncertain etiology  [id number] EKG 12-Lead    PCV ECHOCARDIOGRAM COMPLETE    PCV MYOCARDIAL PERFUSION WO LEXISCAN    2. Essential hypertension  I10 PCV ECHOCARDIOGRAM COMPLETE    PCV MYOCARDIAL PERFUSION WO LEXISCAN    3. Angina pectoris (HCC)  I20.9 PCV ECHOCARDIOGRAM COMPLETE    PCV MYOCARDIAL PERFUSION WO LEXISCAN    4. Mixed hyperlipidemia  E78.2 PCV ECHOCARDIOGRAM COMPLETE    PCV MYOCARDIAL PERFUSION WO LEXISCAN       Orders Placed This Encounter  Procedures   PCV MYOCARDIAL PERFUSION WO LEXISCAN  Standing Status:   Future    Standing Expiration Date:   11/12/2022   EKG 12-Lead   PCV ECHOCARDIOGRAM COMPLETE    Standing  Status:   Future    Standing Expiration Date:   09/14/2023    Meds ordered this encounter  Medications   lisinopril (ZESTRIL) 10 MG tablet    Sig: Take 1 tablet (10 mg total) by mouth daily.    Dispense:  90 tablet    Refill:  3    Medications Discontinued During This Encounter  Medication Reason   Ascorbic Acid (VITAMIN C) 1000 MG tablet Patient Preference   fluticasone (FLONASE) 50 MCG/ACT nasal spray Patient Preference   glucosamine-chondroitin 500-400 MG tablet Patient Preference   cholecalciferol (VITAMIN D) 1000 UNITS tablet Patient Preference   oxyCODONE-acetaminophen (PERCOCET/ROXICET) 5-325 MG per tablet Completed Course   vitamin E 400 UNIT capsule Patient Preference   tamsulosin (FLOMAX) 0.4 MG CAPS capsule Patient Preference   lisinopril (ZESTRIL) 5 MG tablet      Recommendations:   COBI TAFEL is a 72 y.o.  male with chest pain  Chest pain of uncertain etiology Angina pectoris (HCC) Echocardiogram and stress test ordered Patient instructed not to do heavy lifting, heavy exertional activity, swimming until evaluation is complete.  Patient instructed to call if symptoms worse or to go to the ED for further evaluation.   Essential hypertension Continue current cardiac medications. Encourage low-sodium diet, less than 2000 mg daily. Home pressures are <120/70 all of the time. He recently had a week of persistently elevated BP and he was taking 10 mg of lisinopril but then his BP was too low so he was instructed to go back on 5 mg lisinopril. Since he checks his pressure daily, I will have him take 5 mg lisinopril qHS but take 10 mg qHS instead if BP >130/80. Follow-up in 1-2 months or sooner if needed.   Mixed hyperlipidemia Continue lipitor PCP following lipids     Floydene Flock, DO, [facility]  09/13/2022, 10:43 AM Office: [phone] Pager: [phone]

## 2022-09-14 ENCOUNTER — Ambulatory Visit: Payer: PPO

## 2022-09-14 DIAGNOSIS — I10 Essential (primary) hypertension: Secondary | ICD-10-CM

## 2022-09-14 DIAGNOSIS — I209 Angina pectoris, unspecified: Secondary | ICD-10-CM

## 2022-09-14 DIAGNOSIS — R079 Chest pain, unspecified: Secondary | ICD-10-CM

## 2022-09-14 DIAGNOSIS — E782 Mixed hyperlipidemia: Secondary | ICD-10-CM

## 2022-09-16 NOTE — Progress Notes (Signed)
normal

## 2022-09-21 NOTE — Progress Notes (Signed)
Gave patient results, he acknowledged understanding and had no further questions.

## 2022-09-30 ENCOUNTER — Other Ambulatory Visit: Payer: PPO

## 2022-10-04 ENCOUNTER — Ambulatory Visit: Payer: PPO

## 2022-10-04 DIAGNOSIS — E782 Mixed hyperlipidemia: Secondary | ICD-10-CM | POA: Diagnosis not present

## 2022-10-04 DIAGNOSIS — I209 Angina pectoris, unspecified: Secondary | ICD-10-CM | POA: Diagnosis not present

## 2022-10-04 DIAGNOSIS — I10 Essential (primary) hypertension: Secondary | ICD-10-CM

## 2022-10-04 DIAGNOSIS — R079 Chest pain, unspecified: Secondary | ICD-10-CM

## 2022-10-08 LAB — PCV MYOCARDIAL PERFUSION WO LEXISCAN: Angina Index: 0

## 2022-10-21 ENCOUNTER — Encounter: Payer: Self-pay | Admitting: Internal Medicine

## 2022-10-21 ENCOUNTER — Ambulatory Visit: Payer: PPO | Admitting: Internal Medicine

## 2022-10-21 VITALS — BP 154/79 | HR 62 | Resp 16 | Ht 69.0 in | Wt 177.0 lb

## 2022-10-21 DIAGNOSIS — E782 Mixed hyperlipidemia: Secondary | ICD-10-CM

## 2022-10-21 DIAGNOSIS — I209 Angina pectoris, unspecified: Secondary | ICD-10-CM | POA: Diagnosis not present

## 2022-10-21 DIAGNOSIS — R9439 Abnormal result of other cardiovascular function study: Secondary | ICD-10-CM

## 2022-10-21 DIAGNOSIS — I10 Essential (primary) hypertension: Secondary | ICD-10-CM

## 2022-10-21 MED ORDER — ASPIRIN 81 MG PO TBEC: 81.0000 mg | DELAYED_RELEASE_TABLET | Freq: Every day | ORAL | 3 refills | Status: AC

## 2022-10-21 MED ORDER — ROSUVASTATIN CALCIUM 20 MG PO TABS: 20.0000 mg | ORAL_TABLET | Freq: Every day | ORAL | 3 refills | Status: DC

## 2022-10-21 MED ORDER — NITROGLYCERIN 0.4 MG SL SUBL: 0.4000 mg | SUBLINGUAL_TABLET | SUBLINGUAL | 3 refills | Status: DC | PRN

## 2022-10-21 MED ORDER — METOPROLOL SUCCINATE ER 25 MG PO TB24: 25.0000 mg | ORAL_TABLET | Freq: Every day | ORAL | 3 refills | Status: DC

## 2022-10-21 NOTE — H&P (View-Only) (Signed)
 Primary Physician/Referring:  Harris, William, MD  Patient ID: Cory Reid, male    DOB: 03/26/1951, 71 y.o.   MRN: [id number]  Chief Complaint  Patient presents with   Chest Pain   Follow-up   Results   HPI:    Cory Reid  is a 71 y.o. male with hypertension and hyperlipidemia who is here for a follow-up visit regarding chest pain. He noticed chest tightness playing softball and this has never happened to him before in addition to some episodes of chest tightness and near syncope while at the beach. His nuclear stress test was positive for reversible ischemia. Given his family history, symptoms, and positive stress test, patient is agreeable to proceed with heart catheterization. His father had an MI and recently his daughter who is in her 40s had a heart attack requiring 4 stents to her coronaries. He denies shortness of breath, palpitations, diaphoresis, syncope, edema, orthopnea, PND, claudication.  Past Medical History:  Diagnosis Date   High cholesterol    Hypertension    Insomnia    Kidney stone    Past Surgical History:  Procedure Laterality Date   ANKLE SURGERY     SHOULDER SURGERY  1975   Family History  Problem Relation Age of Onset   Hypertension Father    Heart disease Father    Heart attack Father    Diabetes Father     Social History   Tobacco Use   Smoking status: Never   Smokeless tobacco: Never  Substance Use Topics   Alcohol use: No   Marital Status: Married  ROS  Review of Systems  Cardiovascular:  Positive for chest pain.   Objective  Blood pressure (!) 154/79, pulse 62, resp. rate 16, height 5' 9" (1.753 m), weight 177 lb (80.3 kg), SpO2 99 %. Body mass index is 26.14 kg/m.     10/21/2022    9:53 AM 10/21/2022    9:50 AM 09/13/2022   10:16 AM  Vitals with BMI  Height  5' 9"   Weight  177 lbs   BMI  26.13   Systolic 154 171 155  Diastolic 79 88 89  Pulse 62 64 61     Physical Exam Vitals reviewed.  HENT:     Head:  Normocephalic and atraumatic.  Cardiovascular:     Rate and Rhythm: Normal rate and regular rhythm.     Pulses: Normal pulses.     Heart sounds: Normal heart sounds. No murmur heard. Pulmonary:     Effort: Pulmonary effort is normal.     Breath sounds: Normal breath sounds.  Abdominal:     General: Bowel sounds are normal.  Musculoskeletal:     Right lower leg: No edema.     Left lower leg: No edema.  Skin:    General: Skin is warm and dry.  Neurological:     Mental Status: He is alert.     Medications and allergies   Allergies  Allergen Reactions   Penicillins Swelling     Medication list after today's encounter   Current Outpatient Medications:    aspirin EC 81 MG tablet, Take 1 tablet (81 mg total) by mouth daily., Disp: 90 tablet, Rfl: 3   Coenzyme Q-10 200 MG CAPS, Take 1 capsule by mouth daily., Disp: , Rfl:    levothyroxine (SYNTHROID) 25 MCG tablet, Take 25 mcg by mouth daily., Disp: , Rfl:    lisinopril (ZESTRIL) 10 MG tablet, Take 1 tablet (10 mg total) by   mouth daily., Disp: 90 tablet, Rfl: 3   metoprolol succinate (TOPROL XL) 25 MG 24 hr tablet, Take 1 tablet (25 mg total) by mouth daily., Disp: 90 tablet, Rfl: 3   Multiple Vitamin (MULTIVITAMIN WITH MINERALS) TABS tablet, Take 1 tablet by mouth 2 (two) times daily., Disp: , Rfl:    nitroGLYCERIN (NITROSTAT) 0.4 MG SL tablet, Place 1 tablet (0.4 mg total) under the tongue every 5 (five) minutes as needed for chest pain., Disp: 90 tablet, Rfl: 3   Omega-3 Fatty Acids (FISH OIL) 1000 MG CAPS, Take by mouth., Disp: , Rfl:    rosuvastatin (CRESTOR) 20 MG tablet, Take 1 tablet (20 mg total) by mouth at bedtime., Disp: 90 tablet, Rfl: 3   zolpidem (AMBIEN) 10 MG tablet, Take 5 mg by mouth at bedtime as needed for sleep., Disp: , Rfl:   Laboratory examination:   Lab Results  Component Value Date   NA 136 04/27/2013   K 4.0 04/27/2013   CO2 25 04/27/2013   GLUCOSE 104 (H) 04/27/2013   BUN 25 (H) 04/27/2013    CREATININE 1.23 04/27/2013   CALCIUM 9.3 04/27/2013   GFRNONAA 61 (L) 04/27/2013       Latest Ref Rng & Units 04/27/2013    8:50 AM  CMP  Glucose 70 - 99 mg/dL 104   BUN 6 - 23 mg/dL 25   Creatinine 0.50 - 1.35 mg/dL 1.23   Sodium 135 - 145 mEq/L 136   Potassium 3.5 - 5.1 mEq/L 4.0   Chloride 96 - 112 mEq/L 102   CO2 19 - 32 mEq/L 25   Calcium 8.4 - 10.5 mg/dL 9.3   Total Protein 6.0 - 8.3 g/dL 8.7   Total Bilirubin 0.3 - 1.2 mg/dL 0.3   Alkaline Phos 39 - 117 U/L 55   AST 0 - 37 U/L 25   ALT 0 - 53 U/L 36       Latest Ref Rng & Units 04/27/2013    8:50 AM 07/21/2006   10:42 AM  CBC  WBC 4.0 - 10.5 K/uL 9.0  4.5   Hemoglobin 13.0 - 17.0 g/dL 12.5  12.7   Hematocrit 39.0 - 52.0 % 37.2  38.4   Platelets 150 - 400 K/uL 257  282     Lipid Panel No results for input(s): "CHOL", "TRIG", "LDLCALC", "VLDL", "HDL", "CHOLHDL", "LDLDIRECT" in the last 8760 hours.  HEMOGLOBIN A1C No results found for: "HGBA1C", "MPG" TSH No results for input(s): "TSH" in the last 8760 hours.  External labs:  Labs 06/03/2022: hgbA1c = 6.3 Cholesterol 175 HDL 47 triglycerides 141 LDL 103 BUN 19 Cr 1.10 HGB 12.2 HCT 35.8 PLT 246   Radiology:    Cardiac Studies:   Echocardiogram 09/14/2022:  Normal LV systolic function with visual EF 55-60%. Left ventricle cavity  is normal in size. Normal left ventricular wall thickness. Normal global  wall motion. Normal diastolic filling pattern. Calculated EF 62%.  Structurally normal tricuspid valve with trace regurgitation. No evidence  of pulmonary hypertension.  No prior available for comparison.    Exercise nuclear stress test 10/06/2022: There is a moderate sized mild reversible defect in the inferior and apical regions.  Overall LV systolic function is low normal without regional wall motion abnormalities.  Stress LV EF: 50%.  Normal ECG stress. The patient exercised for 12 minutes and 0 seconds of a Bruce protocol, achieving approximately  13.48 METs & 101% MPHR. Excellent effort. Resting EKG demonstrated normal sinus rhythm. The heart rate   response was normal. The baseline blood pressure was 156/90 mmHg and increased to 220/90 mmHg, which is a hypertensive response to exercise. Peak EKG revealed 2 mm horizontal ST depression, resolved at <1 min into recovery.  No arrhythmias. Recovery EKG revealed no ST-T wave abnormalities. The calculated Duke Treadmill Score is 12 No previous exam available for comparison. Low risk.    EKG:   09/13/2022: Sinus Bradycardia, rate 58 bpm. Normal axis, no ischemia.  Assessment     ICD-10-CM   1. Angina pectoris  I20.9 CBC    Basic metabolic panel    Lipid Panel With LDL/HDL Ratio    2. Essential hypertension  I10 CBC    Basic metabolic panel    Lipid Panel With LDL/HDL Ratio    3. Mixed hyperlipidemia  E78.2 CBC    Basic metabolic panel    Lipid Panel With LDL/HDL Ratio    4. Positive cardiac stress test  R94.39        Orders Placed This Encounter  Procedures   CBC   Basic metabolic panel   Lipid Panel With LDL/HDL Ratio    Meds ordered this encounter  Medications   metoprolol succinate (TOPROL XL) 25 MG 24 hr tablet    Sig: Take 1 tablet (25 mg total) by mouth daily.    Dispense:  90 tablet    Refill:  3   aspirin EC 81 MG tablet    Sig: Take 1 tablet (81 mg total) by mouth daily.    Dispense:  90 tablet    Refill:  3   rosuvastatin (CRESTOR) 20 MG tablet    Sig: Take 1 tablet (20 mg total) by mouth at bedtime.    Dispense:  90 tablet    Refill:  3   nitroGLYCERIN (NITROSTAT) 0.4 MG SL tablet    Sig: Place 1 tablet (0.4 mg total) under the tongue every 5 (five) minutes as needed for chest pain.    Dispense:  90 tablet    Refill:  3    Medications Discontinued During This Encounter  Medication Reason   atorvastatin (LIPITOR) 10 MG tablet      Recommendations:   Logyn W Prouse is a 71 y.o.  male with chest pain  Positive nuclear stress test Angina pectoris   Stress test positive for reversible ischemia. Schedule for cardiac catheterization, and possible angioplasty. We discussed regarding risks, benefits, alternatives to this including stress testing, CTA and continued medical therapy. Patient wants to proceed. Understands <1-2% risk of death, stroke, MI, urgent CABG, bleeding, infection, renal failure but not limited to these. Patient instructed not to do heavy lifting, heavy exertional activity, swimming until evaluation is complete.  Patient instructed to call if symptoms worse or to go to the ED for further evaluation. ASA 81 mg, Toprol XL 25 mg, and SL nitro sent to pharmacy   Essential hypertension Continue current cardiac medications. Encourage low-sodium diet, less than 2000 mg daily. His BP is quite elevated today but he brought his machine and showed me he is controlled at home always <120/75 Home pressures are <120/70 all of the time. He recently had a week of persistently elevated BP and he was taking 10 mg of lisinopril but then his BP was too low so he was instructed to go back on 5 mg lisinopril. Since he checks his pressure daily, I will have him take 5 mg lisinopril qHS but take 10 mg qHS instead if BP >130/80. Follow-up in 1-2 months or sooner if   needed.   Mixed hyperlipidemia Switch to Crestor 20 mg qHS Repeat lipid panel    Hogan Hoobler, DO, FACC  10/21/2022, 10:57 AM Office: [phone] Pager: [phone]  

## 2022-10-21 NOTE — Progress Notes (Signed)
Primary Physician/Referring:  Shirline Frees, MD  Patient ID: Cory Reid, male    DOB: [birth year], 72 y.o.   MRN: [id number]  Chief Complaint  Patient presents with   Chest Pain   Follow-up   Results   HPI:    Cory Reid  is a 72 y.o. male with hypertension and hyperlipidemia who is here for a follow-up visit regarding chest pain. He noticed chest tightness playing softball and this has never happened to him before in addition to some episodes of chest tightness and near syncope while at the beach. His nuclear stress test was positive for reversible ischemia. Given his family history, symptoms, and positive stress test, patient is agreeable to proceed with heart catheterization. His father had an MI and recently his daughter who is in her 2s had a heart attack requiring 4 stents to her coronaries. He denies shortness of breath, palpitations, diaphoresis, syncope, edema, orthopnea, PND, claudication.  Past Medical History:  Diagnosis Date   High cholesterol    Hypertension    Insomnia    Kidney stone    Past Surgical History:  Procedure Laterality Date   ANKLE SURGERY     SHOULDER SURGERY  1975   Family History  Problem Relation Age of Onset   Hypertension Father    Heart disease Father    Heart attack Father    Diabetes Father     Social History   Tobacco Use   Smoking status: Never   Smokeless tobacco: Never  Substance Use Topics   Alcohol use: No   Marital Status: Married  ROS  Review of Systems  Cardiovascular:  Positive for chest pain.   Objective  Blood pressure (!) 154/79, pulse 62, resp. rate 16, height 5\' 9"  (1.753 m), weight 177 lb (80.3 kg), SpO2 99 %. Body mass index is 26.14 kg/m.     10/21/2022    9:53 AM 10/21/2022    9:50 AM 09/13/2022   10:16 AM  Vitals with BMI  Height  5\' 9"    Weight  177 lbs   BMI  123456   Systolic [id number]  Diastolic 79 88 89  Pulse 62 64 61     Physical Exam Vitals reviewed.  HENT:     Head:  Normocephalic and atraumatic.  Cardiovascular:     Rate and Rhythm: Normal rate and regular rhythm.     Pulses: Normal pulses.     Heart sounds: Normal heart sounds. No murmur heard. Pulmonary:     Effort: Pulmonary effort is normal.     Breath sounds: Normal breath sounds.  Abdominal:     General: Bowel sounds are normal.  Musculoskeletal:     Right lower leg: No edema.     Left lower leg: No edema.  Skin:    General: Skin is warm and dry.  Neurological:     Mental Status: He is alert.     Medications and allergies   Allergies  Allergen Reactions   Penicillins Swelling     Medication list after today's encounter   Current Outpatient Medications:    aspirin EC 81 MG tablet, Take 1 tablet (81 mg total) by mouth daily., Disp: 90 tablet, Rfl: 3   Coenzyme Q-10 200 MG CAPS, Take 1 capsule by mouth daily., Disp: , Rfl:    levothyroxine (SYNTHROID) 25 MCG tablet, Take 25 mcg by mouth daily., Disp: , Rfl:    lisinopril (ZESTRIL) 10 MG tablet, Take 1 tablet (10 mg total) by  mouth daily., Disp: 90 tablet, Rfl: 3   metoprolol succinate (TOPROL XL) 25 MG 24 hr tablet, Take 1 tablet (25 mg total) by mouth daily., Disp: 90 tablet, Rfl: 3   Multiple Vitamin (MULTIVITAMIN WITH MINERALS) TABS tablet, Take 1 tablet by mouth 2 (two) times daily., Disp: , Rfl:    nitroGLYCERIN (NITROSTAT) 0.4 MG SL tablet, Place 1 tablet (0.4 mg total) under the tongue every 5 (five) minutes as needed for chest pain., Disp: 90 tablet, Rfl: 3   Omega-3 Fatty Acids (FISH OIL) 1000 MG CAPS, Take by mouth., Disp: , Rfl:    rosuvastatin (CRESTOR) 20 MG tablet, Take 1 tablet (20 mg total) by mouth at bedtime., Disp: 90 tablet, Rfl: 3   zolpidem (AMBIEN) 10 MG tablet, Take 5 mg by mouth at bedtime as needed for sleep., Disp: , Rfl:   Laboratory examination:   Lab Results  Component Value Date   NA 136 04/27/2013   K 4.0 04/27/2013   CO2 25 04/27/2013   GLUCOSE 104 (H) 04/27/2013   BUN 25 (H) 04/27/2013    CREATININE 1.23 04/27/2013   CALCIUM 9.3 04/27/2013   GFRNONAA 61 (L) 04/27/2013       Latest Ref Rng & Units 04/27/2013    8:50 AM  CMP  Glucose 70 - 99 mg/dL 104   BUN 6 - 23 mg/dL 25   Creatinine 0.50 - 1.35 mg/dL 1.23   Sodium 135 - 145 mEq/L 136   Potassium 3.5 - 5.1 mEq/L 4.0   Chloride 96 - 112 mEq/L 102   CO2 19 - 32 mEq/L 25   Calcium 8.4 - 10.5 mg/dL 9.3   Total Protein 6.0 - 8.3 g/dL 8.7   Total Bilirubin 0.3 - 1.2 mg/dL 0.3   Alkaline Phos 39 - 117 U/L 55   AST 0 - 37 U/L 25   ALT 0 - 53 U/L 36       Latest Ref Rng & Units 04/27/2013    8:50 AM 07/21/2006   10:42 AM  CBC  WBC 4.0 - 10.5 K/uL 9.0  4.5   Hemoglobin 13.0 - 17.0 g/dL 12.5  12.7   Hematocrit 39.0 - 52.0 % 37.2  38.4   Platelets 150 - 400 K/uL 257  282     Lipid Panel No results for input(s): "CHOL", "TRIG", "LDLCALC", "VLDL", "HDL", "CHOLHDL", "LDLDIRECT" in the last 8760 hours.  HEMOGLOBIN A1C No results found for: "HGBA1C", "MPG" TSH No results for input(s): "TSH" in the last 8760 hours.  External labs:  Labs 06/03/2022: hgbA1c = 6.3 Cholesterol 175 HDL 47 triglycerides 141 LDL 103 BUN 19 Cr 1.10 HGB 12.2 HCT 35.8 PLT 246   Radiology:    Cardiac Studies:   Echocardiogram 09/14/2022:  Normal LV systolic function with visual EF 55-60%. Left ventricle cavity  is normal in size. Normal left ventricular wall thickness. Normal global  wall motion. Normal diastolic filling pattern. Calculated EF 62%.  Structurally normal tricuspid valve with trace regurgitation. No evidence  of pulmonary hypertension.  No prior available for comparison.    Exercise nuclear stress test 10/06/2022: There is a moderate sized mild reversible defect in the inferior and apical regions.  Overall LV systolic function is low normal without regional wall motion abnormalities.  Stress LV EF: 50%.  Normal ECG stress. The patient exercised for 12 minutes and 0 seconds of a Bruce protocol, achieving approximately  13.48 METs & 101% MPHR. Excellent effort. Resting EKG demonstrated normal sinus rhythm. The heart rate  response was normal. The baseline blood pressure was 156/90 mmHg and increased to 220/90 mmHg, which is a hypertensive response to exercise. Peak EKG revealed 2 mm horizontal ST depression, resolved at <1 min into recovery.  No arrhythmias. Recovery EKG revealed no ST-T wave abnormalities. The calculated Duke Treadmill Score is 12 No previous exam available for comparison. Low risk.    EKG:   09/13/2022: Sinus Bradycardia, rate 58 bpm. Normal axis, no ischemia.  Assessment     ICD-10-CM   1. Angina pectoris  I20.9 CBC    Basic metabolic panel    Lipid Panel With LDL/HDL Ratio    2. Essential hypertension  I10 CBC    Basic metabolic panel    Lipid Panel With LDL/HDL Ratio    3. Mixed hyperlipidemia  E78.2 CBC    Basic metabolic panel    Lipid Panel With LDL/HDL Ratio    4. Positive cardiac stress test  R94.39        Orders Placed This Encounter  Procedures   CBC   Basic metabolic panel   Lipid Panel With LDL/HDL Ratio    Meds ordered this encounter  Medications   metoprolol succinate (TOPROL XL) 25 MG 24 hr tablet    Sig: Take 1 tablet (25 mg total) by mouth daily.    Dispense:  90 tablet    Refill:  3   aspirin EC 81 MG tablet    Sig: Take 1 tablet (81 mg total) by mouth daily.    Dispense:  90 tablet    Refill:  3   rosuvastatin (CRESTOR) 20 MG tablet    Sig: Take 1 tablet (20 mg total) by mouth at bedtime.    Dispense:  90 tablet    Refill:  3   nitroGLYCERIN (NITROSTAT) 0.4 MG SL tablet    Sig: Place 1 tablet (0.4 mg total) under the tongue every 5 (five) minutes as needed for chest pain.    Dispense:  90 tablet    Refill:  3    Medications Discontinued During This Encounter  Medication Reason   atorvastatin (LIPITOR) 10 MG tablet      Recommendations:   AWAD HOGLUND is a 72 y.o.  male with chest pain  Positive nuclear stress test Angina pectoris   Stress test positive for reversible ischemia. Schedule for cardiac catheterization, and possible angioplasty. We discussed regarding risks, benefits, alternatives to this including stress testing, CTA and continued medical therapy. Patient wants to proceed. Understands <1-2% risk of death, stroke, MI, urgent CABG, bleeding, infection, renal failure but not limited to these. Patient instructed not to do heavy lifting, heavy exertional activity, swimming until evaluation is complete.  Patient instructed to call if symptoms worse or to go to the ED for further evaluation. ASA 81 mg, Toprol XL 25 mg, and SL nitro sent to pharmacy   Essential hypertension Continue current cardiac medications. Encourage low-sodium diet, less than 2000 mg daily. His BP is quite elevated today but he brought his machine and showed me he is controlled at home always <120/75 Home pressures are <120/70 all of the time. He recently had a week of persistently elevated BP and he was taking 10 mg of lisinopril but then his BP was too low so he was instructed to go back on 5 mg lisinopril. Since he checks his pressure daily, I will have him take 5 mg lisinopril qHS but take 10 mg qHS instead if BP >130/80. Follow-up in 1-2 months or sooner if  needed.   Mixed hyperlipidemia Switch to Crestor 20 mg qHS Repeat lipid panel    Floydene Flock, DO, [facility]  10/21/2022, 10:57 AM Office: [phone] Pager: [phone]

## 2022-10-22 DIAGNOSIS — I209 Angina pectoris, unspecified: Secondary | ICD-10-CM | POA: Diagnosis not present

## 2022-10-22 DIAGNOSIS — I10 Essential (primary) hypertension: Secondary | ICD-10-CM | POA: Diagnosis not present

## 2022-10-22 DIAGNOSIS — E782 Mixed hyperlipidemia: Secondary | ICD-10-CM | POA: Diagnosis not present

## 2022-10-23 LAB — BASIC METABOLIC PANEL
BUN/Creatinine Ratio: 20 (ref 10–24)
BUN: 25 mg/dL (ref 8–27)
CO2: 21 mmol/L (ref 20–29)
Calcium: 9.7 mg/dL (ref 8.6–10.2)
Chloride: 104 mmol/L (ref 96–106)
Creatinine, Ser: 1.27 mg/dL (ref 0.76–1.27)
Glucose: 92 mg/dL (ref 70–99)
Potassium: 4.8 mmol/L (ref 3.5–5.2)
Sodium: 139 mmol/L (ref 134–144)
eGFR: 60 mL/min/{1.73_m2} (ref >59)

## 2022-10-23 LAB — LIPID PANEL WITH LDL/HDL RATIO
Cholesterol, Total: 171 mg/dL (ref 100–199)
HDL: 50 mg/dL (ref >39)
LDL Chol Calc (NIH): 99 mg/dL (ref 0–99)
LDL/HDL Ratio: 2 ratio (ref 0.0–3.6)
Triglycerides: 121 mg/dL (ref 0–149)
VLDL Cholesterol Cal: 22 mg/dL (ref 5–40)

## 2022-10-23 LAB — CBC
Hematocrit: 39.3 % (ref 37.5–51.0)
Hemoglobin: 13.1 g/dL (ref 13.0–17.7)
MCH: 31.9 pg (ref 26.6–33.0)
MCHC: 33.3 g/dL (ref 31.5–35.7)
MCV: 96 fL (ref 79–97)
Platelets: 249 10*3/uL (ref 150–450)
RBC: 4.11 x10E6/uL — ABNORMAL LOW (ref 4.14–5.80)
RDW: 12.8 % (ref 11.6–15.4)
WBC: 3.8 10*3/uL (ref 3.4–10.8)

## 2022-11-03 ENCOUNTER — Telehealth: Payer: Self-pay

## 2022-11-03 NOTE — Telephone Encounter (Signed)
Patient has not been taking Lisinopril at all, only metoprolol he says his bp have been running with in the normal range. Is it ok that he doesn't take it.  4/13     108/59    58 4/14    105/60  52 4/16     129/70    52 4/17     121/69    53 4/18     120/66     57

## 2022-11-05 ENCOUNTER — Encounter (HOSPITAL_COMMUNITY)
Admission: RE | Disposition: A | Discharge status: Home / Self Care | Payer: Self-pay | Source: Home / Self Care | Attending: Cardiology

## 2022-11-05 ENCOUNTER — Encounter (HOSPITAL_COMMUNITY): Payer: Self-pay | Admitting: Cardiology

## 2022-11-05 ENCOUNTER — Other Ambulatory Visit: Payer: Self-pay

## 2022-11-05 ENCOUNTER — Ambulatory Visit (HOSPITAL_COMMUNITY)
Admission: RE | Admit: 2022-11-05 | Discharge: 2022-11-05 | Disposition: A | Discharge status: Home / Self Care | Payer: PPO | Attending: Cardiology | Admitting: Cardiology

## 2022-11-05 DIAGNOSIS — I10 Essential (primary) hypertension: Secondary | ICD-10-CM | POA: Diagnosis not present

## 2022-11-05 DIAGNOSIS — I25119 Atherosclerotic heart disease of native coronary artery with unspecified angina pectoris: Secondary | ICD-10-CM | POA: Diagnosis not present

## 2022-11-05 DIAGNOSIS — I25118 Atherosclerotic heart disease of native coronary artery with other forms of angina pectoris: Secondary | ICD-10-CM

## 2022-11-05 DIAGNOSIS — I2584 Coronary atherosclerosis due to calcified coronary lesion: Secondary | ICD-10-CM | POA: Diagnosis not present

## 2022-11-05 DIAGNOSIS — R9439 Abnormal result of other cardiovascular function study: Secondary | ICD-10-CM | POA: Insufficient documentation

## 2022-11-05 DIAGNOSIS — Z79899 Other long term (current) drug therapy: Secondary | ICD-10-CM | POA: Diagnosis not present

## 2022-11-05 DIAGNOSIS — E782 Mixed hyperlipidemia: Secondary | ICD-10-CM | POA: Diagnosis not present

## 2022-11-05 HISTORY — PX: LEFT HEART CATH AND CORONARY ANGIOGRAPHY: CATH118249

## 2022-11-05 SURGERY — LEFT HEART CATH AND CORONARY ANGIOGRAPHY
Anesthesia: LOCAL

## 2022-11-05 MED ORDER — SODIUM CHLORIDE 0.9% FLUSH: 3.0000 mL | INTRAVENOUS | Status: DC | PRN

## 2022-11-05 MED ORDER — VERAPAMIL HCL 2.5 MG/ML IV SOLN
INTRAVENOUS | Status: DC | PRN
  Administered 2022-11-05: 10 mL via INTRA_ARTERIAL

## 2022-11-05 MED ORDER — SODIUM CHLORIDE 0.9 % WEIGHT BASED INFUSION
1.0000 mL/kg/h | INTRAVENOUS | Status: DC
Start: 2022-11-06 — End: 2022-11-05

## 2022-11-05 MED ORDER — SODIUM CHLORIDE 0.9 % IV SOLN: 250.0000 mL | INTRAVENOUS | Status: DC | PRN

## 2022-11-05 MED ORDER — HEPARIN SODIUM (PORCINE) 1000 UNIT/ML IJ SOLN
INTRAMUSCULAR | Status: DC | PRN
  Administered 2022-11-05: 4000 [IU] via INTRAVENOUS

## 2022-11-05 MED ORDER — LIDOCAINE HCL (PF) 1 % IJ SOLN
INTRAMUSCULAR | Status: DC | PRN
  Administered 2022-11-05: 2 mL

## 2022-11-05 MED ORDER — FENTANYL CITRATE (PF) 100 MCG/2ML IJ SOLN
INTRAMUSCULAR | Status: DC | PRN
  Administered 2022-11-05: 50 ug via INTRAVENOUS

## 2022-11-05 MED ORDER — IOHEXOL 350 MG/ML SOLN
INTRAVENOUS | Status: DC | PRN
  Administered 2022-11-05: 60 mL

## 2022-11-05 MED ORDER — HEPARIN SODIUM (PORCINE) 1000 UNIT/ML IJ SOLN
INTRAMUSCULAR | Status: AC
  Filled 2022-11-05: qty 10

## 2022-11-05 MED ORDER — SODIUM CHLORIDE 0.9 % WEIGHT BASED INFUSION
3.0000 mL/kg/h | INTRAVENOUS | Status: DC
Start: 2022-11-06 — End: 2022-11-05

## 2022-11-05 MED ORDER — VERAPAMIL HCL 2.5 MG/ML IV SOLN
INTRAVENOUS | Status: AC
  Filled 2022-11-05: qty 2

## 2022-11-05 MED ORDER — SODIUM CHLORIDE 0.9 % WEIGHT BASED INFUSION
3.0000 mL/kg/h | INTRAVENOUS | Status: AC
  Administered 2022-11-05: 3 mL/kg/h via INTRAVENOUS

## 2022-11-05 MED ORDER — SODIUM CHLORIDE 0.9 % WEIGHT BASED INFUSION: 1.0000 mL/kg/h | INTRAVENOUS | Status: DC

## 2022-11-05 MED ORDER — MIDAZOLAM HCL 2 MG/2ML IJ SOLN
INTRAMUSCULAR | Status: AC
  Filled 2022-11-05: qty 2

## 2022-11-05 MED ORDER — MIDAZOLAM HCL 2 MG/2ML IJ SOLN
INTRAMUSCULAR | Status: DC | PRN
  Administered 2022-11-05: 2 mg via INTRAVENOUS

## 2022-11-05 MED ORDER — SODIUM CHLORIDE 0.9% FLUSH: 3.0000 mL | Freq: Two times a day (BID) | INTRAVENOUS | Status: DC

## 2022-11-05 MED ORDER — ACETAMINOPHEN 325 MG PO TABS: 650.0000 mg | ORAL_TABLET | ORAL | Status: DC | PRN

## 2022-11-05 MED ORDER — LIDOCAINE HCL (PF) 1 % IJ SOLN
INTRAMUSCULAR | Status: AC
  Filled 2022-11-05: qty 30

## 2022-11-05 MED ORDER — FENTANYL CITRATE (PF) 100 MCG/2ML IJ SOLN
INTRAMUSCULAR | Status: AC
  Filled 2022-11-05: qty 2

## 2022-11-05 MED ORDER — HEPARIN (PORCINE) IN NACL 1000-0.9 UT/500ML-% IV SOLN
INTRAVENOUS | Status: DC | PRN
  Administered 2022-11-05 (×2): 500 mL

## 2022-11-05 MED ORDER — ASPIRIN 81 MG PO CHEW: 81.0000 mg | CHEWABLE_TABLET | ORAL | Status: DC

## 2022-11-05 MED ORDER — ONDANSETRON HCL 4 MG/2ML IJ SOLN: 4.0000 mg | Freq: Four times a day (QID) | INTRAMUSCULAR | Status: DC | PRN

## 2022-11-05 SURGICAL SUPPLY — 11 items
BAND CMPR LRG ZPHR (HEMOSTASIS) ×1
BAND ZEPHYR COMPRESS 30 LONG (HEMOSTASIS) IMPLANT
CATH OPTITORQUE TIG 4.0 5F (CATHETERS) IMPLANT
GLIDESHEATH SLEND A-KIT 6F 22G (SHEATH) IMPLANT
GUIDEWIRE ANGLED .035X150CM (WIRE) IMPLANT
GUIDEWIRE INQWIRE 1.5J.035X260 (WIRE) IMPLANT
INQWIRE 1.5J .035X260CM (WIRE) ×1
KIT HEART LEFT (KITS) ×2 IMPLANT
PACK CARDIAC CATHETERIZATION (CUSTOM PROCEDURE TRAY) ×2 IMPLANT
TRANSDUCER W/STOPCOCK (MISCELLANEOUS) ×2 IMPLANT
TUBING CIL FLEX 10 FLL-RA (TUBING) ×2 IMPLANT

## 2022-11-05 NOTE — Interval H&P Note (Signed)
History and Physical Interval Note:  11/05/2022 7:39 AM  Reynolds Bowl  has presented today for surgery, with the diagnosis of chest pain - positive stress test.  The various methods of treatment have been discussed with the patient and family. After consideration of risks, benefits and other options for treatment, the patient has consented to  Procedure(s): LEFT HEART CATH AND CORONARY ANGIOGRAPHY (N/A) and possible angioplasty as a surgical intervention.  The patient's history has been reviewed, patient examined, no change in status, stable for surgery.  I have reviewed the patient's chart and labs.  Questions were answered to the patient's satisfaction.   Cath Lab Visit (complete for each Cath Lab visit)  Clinical Evaluation Leading to the Procedure:   ACS: No.  Non-ACS:    Anginal Classification: CCS III  Anti-ischemic medical therapy: Minimal Therapy (1 class of medications)  Non-Invasive Test Results: Intermediate-risk stress test findings: cardiac mortality 1-3%/year  Prior CABG: No previous CABG        Yates Decamp

## 2022-11-05 NOTE — Discharge Instructions (Signed)

## 2022-11-08 ENCOUNTER — Encounter: Payer: Self-pay | Admitting: Internal Medicine

## 2022-11-08 NOTE — Telephone Encounter (Signed)
He can take 12.5 mg every night starting tomorrow

## 2022-11-08 NOTE — Telephone Encounter (Signed)
From patient.

## 2022-11-29 DIAGNOSIS — R7303 Prediabetes: Secondary | ICD-10-CM | POA: Diagnosis not present

## 2022-11-29 DIAGNOSIS — I10 Essential (primary) hypertension: Secondary | ICD-10-CM | POA: Diagnosis not present

## 2022-11-29 DIAGNOSIS — F5101 Primary insomnia: Secondary | ICD-10-CM | POA: Diagnosis not present

## 2022-11-29 DIAGNOSIS — E039 Hypothyroidism, unspecified: Secondary | ICD-10-CM | POA: Diagnosis not present

## 2022-11-29 DIAGNOSIS — Z125 Encounter for screening for malignant neoplasm of prostate: Secondary | ICD-10-CM | POA: Diagnosis not present

## 2022-11-29 DIAGNOSIS — E7800 Pure hypercholesterolemia, unspecified: Secondary | ICD-10-CM | POA: Diagnosis not present

## 2022-11-29 DIAGNOSIS — Z0000 Encounter for general adult medical examination without abnormal findings: Secondary | ICD-10-CM | POA: Diagnosis not present

## 2022-12-02 ENCOUNTER — Encounter: Payer: Self-pay | Admitting: Internal Medicine

## 2022-12-02 ENCOUNTER — Ambulatory Visit: Payer: PPO | Admitting: Internal Medicine

## 2022-12-02 VITALS — BP 155/84 | HR 61 | Ht 69.0 in | Wt 179.6 lb

## 2022-12-02 DIAGNOSIS — I209 Angina pectoris, unspecified: Secondary | ICD-10-CM | POA: Diagnosis not present

## 2022-12-02 DIAGNOSIS — I10 Essential (primary) hypertension: Secondary | ICD-10-CM | POA: Diagnosis not present

## 2022-12-02 DIAGNOSIS — E782 Mixed hyperlipidemia: Secondary | ICD-10-CM | POA: Diagnosis not present

## 2022-12-02 NOTE — Progress Notes (Signed)
Primary Physician/Referring:  Johny Blamer, MD  Patient ID: Cory Reid, male    DOB: 14-Dec-1950, 72 y.o.   MRN: [id number]  Chief Complaint  Patient presents with   Chest Pain   Follow-up   Hospitalization Follow-up        HPI:    Cory Reid  is a 72 y.o. male with hypertension and hyperlipidemia who is here for a follow-up visit after cardiac catheterization. He has not been having any chest pain anymore. He is back to playing baseball without any issues. He does have some leg pain with Crestor so we will not increase the dose at this time. He is glad he did not require any stenting given his significant family history. Overall he feels great and is back to his normal acitivities.  He denies shortness of breath, palpitations, diaphoresis, syncope, edema, orthopnea, PND, claudication.  Past Medical History:  Diagnosis Date   High cholesterol    Hypertension    Insomnia    Kidney stone    Past Surgical History:  Procedure Laterality Date   ANKLE SURGERY     LEFT HEART CATH AND CORONARY ANGIOGRAPHY N/A 11/05/2022   Procedure: LEFT HEART CATH AND CORONARY ANGIOGRAPHY;  Surgeon: Yates Decamp, MD;  Location: [facility];  Service: Cardiovascular;  Laterality: N/A;   SHOULDER SURGERY  1975   Family History  Problem Relation Age of Onset   Hypertension Father    Heart disease Father    Heart attack Father    Diabetes Father     Social History   Tobacco Use   Smoking status: Never   Smokeless tobacco: Never  Substance Use Topics   Alcohol use: No   Marital Status: Married  ROS  Review of Systems  Cardiovascular:  Negative for chest pain, dyspnea on exertion and leg swelling.   Objective  Blood pressure (!) 155/84, pulse 61, height 5\' 9"  (1.753 m), weight 179 lb 9.6 oz (81.5 kg), SpO2 97 %. Body mass index is 26.52 kg/m.     12/02/2022    9:42 AM 11/05/2022   10:00 AM 11/05/2022    9:30 AM  Vitals with BMI  Height 5\' 9"     Weight 179 lbs 10 oz     BMI 26.51    Systolic 155 131 865  Diastolic 84 78 69  Pulse 61 52 55     Physical Exam Vitals reviewed.  HENT:     Head: Normocephalic and atraumatic.  Cardiovascular:     Rate and Rhythm: Normal rate and regular rhythm.     Pulses: Normal pulses.     Heart sounds: Normal heart sounds. No murmur heard. Pulmonary:     Effort: Pulmonary effort is normal.     Breath sounds: Normal breath sounds.  Abdominal:     General: Bowel sounds are normal.  Musculoskeletal:     Right lower leg: No edema.     Left lower leg: No edema.  Skin:    General: Skin is warm and dry.  Neurological:     Mental Status: He is alert.     Medications and allergies   Allergies  Allergen Reactions   Penicillins Swelling   Erythromycin Swelling     Medication list after today's encounter   Current Outpatient Medications:    aspirin EC 81 MG tablet, Take 1 tablet (81 mg total) by mouth daily., Disp: 90 tablet, Rfl: 3   Coenzyme Q10 (COQ10) 400 MG CAPS, Take 400 mg by  mouth daily., Disp: , Rfl:    levothyroxine (SYNTHROID) 25 MCG tablet, Take 25 mcg by mouth daily., Disp: , Rfl:    metoprolol succinate (TOPROL XL) 25 MG 24 hr tablet, Take 1 tablet (25 mg total) by mouth daily. (Patient taking differently: Take 12.5 mg by mouth daily.), Disp: 90 tablet, Rfl: 3   Multiple Vitamin (MULTIVITAMIN WITH MINERALS) TABS tablet, Take 1 tablet by mouth daily., Disp: , Rfl:    Naphazoline-Pheniramine (VISINE OP), Place 1 drop into both eyes daily as needed (allergies)., Disp: , Rfl:    nitroGLYCERIN (NITROSTAT) 0.4 MG SL tablet, Place 1 tablet (0.4 mg total) under the tongue every 5 (five) minutes as needed for chest pain., Disp: 90 tablet, Rfl: 3   Omega-3 Fatty Acids (FISH OIL) 1200 MG CAPS, Take 1,200 mg by mouth daily., Disp: , Rfl:    rosuvastatin (CRESTOR) 20 MG tablet, Take 1 tablet (20 mg total) by mouth at bedtime., Disp: 90 tablet, Rfl: 3   Trolamine Salicylate (BLUE-EMU MAXIMUM PAIN RELIEF EX), Apply  1 Application topically daily as needed (pain)., Disp: , Rfl:    zolpidem (AMBIEN) 10 MG tablet, Take 5 mg by mouth at bedtime., Disp: , Rfl:   Laboratory examination:   Lab Results  Component Value Date   NA 139 10/22/2022   K 4.8 10/22/2022   CO2 21 10/22/2022   GLUCOSE 92 10/22/2022   BUN 25 10/22/2022   CREATININE 1.27 10/22/2022   CALCIUM 9.7 10/22/2022   EGFR 60 10/22/2022   GFRNONAA 61 (L) 04/27/2013       Latest Ref Rng & Units 10/22/2022    8:50 AM 04/27/2013    8:50 AM  CMP  Glucose 70 - 99 mg/dL 92  161   BUN 8 - 27 mg/dL 25  25   Creatinine 0.96 - 1.27 mg/dL 0.45  4.09   Sodium 811 - 144 mmol/L 139  136   Potassium 3.5 - 5.2 mmol/L 4.8  4.0   Chloride 96 - 106 mmol/L 104  102   CO2 20 - 29 mmol/L 21  25   Calcium 8.6 - 10.2 mg/dL 9.7  9.3   Total Protein 6.0 - 8.3 g/dL  8.7   Total Bilirubin 0.3 - 1.2 mg/dL  0.3   Alkaline Phos 39 - 117 U/L  55   AST 0 - 37 U/L  25   ALT 0 - 53 U/L  36       Latest Ref Rng & Units 10/22/2022    8:50 AM 04/27/2013    8:50 AM 07/21/2006   10:42 AM  CBC  WBC 3.4 - 10.8 x10E3/uL 3.8  9.0  4.5   Hemoglobin 13.0 - 17.7 g/dL 91.4  78.2  95.6   Hematocrit 37.5 - 51.0 % 39.3  37.2  38.4   Platelets 150 - 450 x10E3/uL 249  257  282     Lipid Panel Recent Labs    10/22/22 0850  CHOL 171  TRIG 121  LDLCALC 99  HDL 50    HEMOGLOBIN A1C No results found for: "HGBA1C", "MPG" TSH No results for input(s): "TSH" in the last 8760 hours.  External labs:  Labs 06/03/2022: hgbA1c = 6.3 Cholesterol 175 HDL 47 triglycerides 141 LDL 103 BUN 19 Cr 1.10 HGB 12.2 HCT 35.8 PLT 246   Radiology:    Cardiac Studies:   Echocardiogram 09/14/2022:  Normal LV systolic function with visual EF 55-60%. Left ventricle cavity  is normal in size. Normal left ventricular  wall thickness. Normal global  wall motion. Normal diastolic filling pattern. Calculated EF 62%.  Structurally normal tricuspid valve with trace regurgitation. No evidence   of pulmonary hypertension.  No prior available for comparison.    Exercise nuclear stress test 10/06/2022: There is a moderate sized mild reversible defect in the inferior and apical regions.  Overall LV systolic function is low normal without regional wall motion abnormalities.  Stress LV EF: 50%.  Normal ECG stress. The patient exercised for 12 minutes and 0 seconds of a Bruce protocol, achieving approximately 13.48 METs & 101% MPHR. Excellent effort. Resting EKG demonstrated normal sinus rhythm. The heart rate response was normal. The baseline blood pressure was 156/90 mmHg and increased to 220/90 mmHg, which is a hypertensive response to exercise. Peak EKG revealed 2 mm horizontal ST depression, resolved at <1 min into recovery.  No arrhythmias. Recovery EKG revealed no ST-T wave abnormalities. The calculated Duke Treadmill Score is 12 No previous exam available for comparison. Low risk.    Left Heart Catheterization 11/05/22:  LV: 137/7, EDP 18 mmHg.  Ao 146/66, mean 98 mmHg.  No pressure gradient of aortic valve. RCA: Dominant, minimal disease. LM: Large-caliber vessel, mildly calcified. LAD: Moderate caliber vessel, diffuse mild coronary calcification is evident.  Mid segment has a 50 to 60% stenosis. LCx: Large-caliber vessel, gives origin to small marginals.  Has mild diffuse calcification and mild luminal irregularity in the proximal segment.    Impression: Mild inferior ischemia could be in the distribution of the mid LAD stenosis however the lesion is mildly calcified and very stable.  Since being on beta-blocker therapy and also high intensity statins, patient states that he has started to feel better and he has not had any further chest pain.  Hence continued medical therapy is indicated.  No restrictions from cardiac standpoint.     EKG:   09/13/2022: Sinus Bradycardia, rate 58 bpm. Normal axis, no ischemia.  Assessment     ICD-10-CM   1. Essential hypertension  I10      2. Mixed hyperlipidemia  E78.2     3. Angina pectoris (HCC)  I20.9        No orders of the defined types were placed in this encounter.   No orders of the defined types were placed in this encounter.   Medications Discontinued During This Encounter  Medication Reason   lisinopril (ZESTRIL) 10 MG tablet Completed Course     Recommendations:   JEROL ELZA is a 72 y.o.  male with non-obstructive CAD, HTN, HLD   Angina pectoris  Continue ASA 81 mg, Toprol XL 25 mg, and SL nitro PRN No longer having any symptoms since being on Toprol LHC showed 50-60% mid LAD lesion Continue on Crestor   Essential hypertension Continue current cardiac medications. Encourage low-sodium diet, less than 2000 mg daily. At out last visit he brought his BP machine and showed me he is controlled at home always <120/75 He has significant white coat hypertension Home pressures are <120/70 all of the time. He brings in a log every time and it is always controlled outside of here Follow-up in 6 months or sooner if needed.   Mixed hyperlipidemia Continue Crestor 20 mg qHS Repeat lipid panel shows LDL<100, would like it <70 LDL Chol Calc (NIH) 0 - 99 mg/dL [address], DO, [facility]  12/02/2022, 10:05 AM Office: [phone] Pager: [phone]

## 2023-01-10 DIAGNOSIS — H40013 Open angle with borderline findings, low risk, bilateral: Secondary | ICD-10-CM | POA: Diagnosis not present

## 2023-01-31 DIAGNOSIS — M722 Plantar fascial fibromatosis: Secondary | ICD-10-CM | POA: Diagnosis not present

## 2023-02-07 ENCOUNTER — Encounter: Payer: Self-pay | Admitting: Internal Medicine

## 2023-02-09 NOTE — Telephone Encounter (Signed)
From patient.

## 2023-02-09 NOTE — Telephone Encounter (Signed)
What is his BP running at home (while taking what medication/dose)?  Harinder Romas Tappan, DO, [facility]

## 2023-02-11 NOTE — Telephone Encounter (Signed)
From patient.

## 2023-02-11 NOTE — Telephone Encounter (Signed)
Former patient of Dr. Rozell Searing Custovic.  Was on lisinopril for blood pressure management was transitioned to Toprol-XL.  Due to soft blood pressures in the past Toprol-XL 25 mg p.o. daily was transitioned to 12.5 mg p.o. daily.  However, he still continues to have episodes of lightheadedness, dizziness, orthostasis at times.  So the blood pressure readings that he is currently having are also soft.  To prevent falls or injuries I have asked him to hold off on Toprol-XL 25 mg p.o. daily.  He is advised to start checking his blood pressures twice a day and to record them to see what his blood pressure trends have been running at baseline.  Based on his home blood pressure readings we will decide if medications are warranted.  Patient states that he is going to the beach and will be able to come back for follow-up.  For now I have asked him to take Toprol-XL with him to the beach and if the SBP's are greater than 150 mmHg he can continue with 12.5 mg p.o. daily.  He will make an office visit once he is back from the beach to discuss this further.  Cory Mclaurin Corning, DO, [facility]

## 2023-02-15 ENCOUNTER — Telehealth: Payer: Self-pay

## 2023-02-15 NOTE — Telephone Encounter (Signed)
Patient states his b/p has been going up and down and the metoprolol 12.5mg  is not working pt states he has been getting light headed

## 2023-02-15 NOTE — Telephone Encounter (Signed)
When I called last this is what is said....  "He is advised to start checking his blood pressures twice a day and to record them to see what his blood pressure trends have been running at baseline.  Based on his home blood pressure readings we will decide if medications are warranted.  For now I have asked him to take Toprol-XL with him to the beach and if the SBP's are greater than 150 mmHg he can continue with 12.5 mg p.o. daily."  Cory Masden, DO, [facility]

## 2023-02-16 NOTE — Telephone Encounter (Signed)
Please follow up .   Kaid Seeberger [place], DO, [facility]

## 2023-02-17 NOTE — Telephone Encounter (Signed)
Spoke with patient. Patient understands.

## 2023-02-22 ENCOUNTER — Telehealth: Payer: Self-pay

## 2023-02-23 ENCOUNTER — Other Ambulatory Visit: Payer: Self-pay | Admitting: Internal Medicine

## 2023-02-23 NOTE — Telephone Encounter (Signed)
Please have him come in for an appointment - after his COVID. He was last seen by Dr. Melton Alar in May 2024. I have tried do manage it over the phone and want to reduce the confusion.    [place], DO, [facility]

## 2023-02-23 NOTE — Telephone Encounter (Signed)
Called and spoke to patient he was transferred to front desk to schedule appt

## 2023-03-01 ENCOUNTER — Encounter: Payer: Self-pay | Admitting: Cardiology

## 2023-03-01 ENCOUNTER — Ambulatory Visit: Payer: PPO | Admitting: Cardiology

## 2023-03-01 VITALS — BP 154/80 | HR 60 | Resp 16 | Ht 69.0 in | Wt 175.2 lb

## 2023-03-01 DIAGNOSIS — E782 Mixed hyperlipidemia: Secondary | ICD-10-CM

## 2023-03-01 DIAGNOSIS — I10 Essential (primary) hypertension: Secondary | ICD-10-CM

## 2023-03-01 DIAGNOSIS — I2510 Atherosclerotic heart disease of native coronary artery without angina pectoris: Secondary | ICD-10-CM

## 2023-03-01 NOTE — Progress Notes (Signed)
ID:  Cory Reid, DOB 03-14-1951, MRN [id number]  PCP:  Cory Retort, MD  Cardiologist:  Tessa Lerner, DO, [facility] (established care 03/01/23) Former Cardiology Providers: Dr. Clotilde Dieter  Date: 03/01/23 Last Office Visit: Dec 02, 2022  Chief Complaint  Patient presents with   Hypertension    Medication    HPI  Cory Reid is a 72 y.o. Caucasian male whose past medical history and cardiovascular risk factors include: Hypertension, hyperlipidemia, nonobstructive CAD.  Patient was formally seen by my partner Dr. Clotilde Dieter and I am seeing him for the first time at today's office visit.  He is accompanied by his wife who is a retired Publishing rights manager.  Patient has had a very thorough cardiovascular workup since establishing care reviewed the echocardiogram results, stress test, and heart catheterization report.  In the past he was on lisinopril for blood pressure management and for reasons unknown the medication was discontinued and transition to Toprol-XL.  He has been having difficulty maintaining BP at home. At times he used to take half a tablet now quarter of a tablet.  Home blood pressure log reviewed.  Overall his blood pressures are very well-controlled on visual estimation his SBP ranges between 120-130 mmHg.  At times the systolic blood pressures do go up as high as 150s - 160s but very rare.  Patient states that the blood pressures are usually higher in the morning for him as opposed to evening.  He denies anginal chest pain or heart failure symptoms and overall functional capacity remains stable.  CARDIAC DATABASE: EKG: November 05, 2022: Sinus bradycardia, 49 bpm, without underlying ischemia or injury pattern.  Echocardiogram: 09/14/2022: Normal LV systolic function with visual EF 55-60%. Left ventricle cavity is normal in size. Normal left ventricular wall thickness. Normal global wall motion. Normal diastolic filling pattern. Calculated EF  62%. Structurally normal tricuspid valve with trace regurgitation. No evidence of pulmonary hypertension. No prior available for comparison.   Stress Testing: Exercise nuclear stress test 10/06/2022: There is a moderate sized mild reversible defect in the inferior and apical regions. Overall LV systolic function is low normal without regional wall motion abnormalities.  Stress LV EF: 50%. Normal ECG stress. The patient exercised for 12 minutes and 0 seconds of a Bruce protocol, achieving approximately 13.48 METs & 101% MPHR. Excellent effort. Resting EKG demonstrated normal sinus rhythm. The heart rate response was normal. The baseline blood pressure was 156/90 mmHg and increased to 220/90 mmHg, which is a hypertensive response to exercise. Peak EKG revealed 2 mm horizontal ST depression, resolved at <1 min into recovery.  No arrhythmias. Recovery EKG revealed no ST-T wave abnormalities. The calculated Duke Treadmill Score is 12 No previous exam available for comparison. Low risk.  Heart Catheterization: Left Heart Catheterization 11/05/22:  LV: 137/7, EDP 18 mmHg.  Ao 146/66, mean 98 mmHg.  No pressure gradient of aortic valve. RCA: Dominant, minimal disease. LM: Large-caliber vessel, mildly calcified. LAD: Moderate caliber vessel, diffuse mild coronary calcification is evident.  Mid segment has a 50 to 60% stenosis. LCx: Large-caliber vessel, gives origin to small marginals.  Has mild diffuse calcification and mild luminal irregularity in the proximal segment.    Impression: Mild inferior ischemia could be in the distribution of the mid LAD stenosis however the lesion is mildly calcified and very stable.  Since being on beta-blocker therapy and also high intensity statins, patient states that he has started to feel better and he has not had any further chest  pain.  Hence continued medical therapy is indicated.  No restrictions from cardiac standpoint.   ALLERGIES: Allergies  Allergen  Reactions   Penicillins Swelling   Erythromycin Swelling    MEDICATION LIST PRIOR TO VISIT: Current Meds  Medication Sig   aspirin EC 81 MG tablet Take 1 tablet (81 mg total) by mouth daily.   Coenzyme Q10 (COQ10) 400 MG CAPS Take 400 mg by mouth daily.   levothyroxine (SYNTHROID) 25 MCG tablet Take 25 mcg by mouth daily.   lisinopril (ZESTRIL) 5 MG tablet Take 5 mg by mouth daily at 10 pm.   Multiple Vitamin (MULTIVITAMIN WITH MINERALS) TABS tablet Take 1 tablet by mouth daily.   rosuvastatin (CRESTOR) 20 MG tablet TAKE ONE TABLET BY MOUTH EVERYDAY AT BEDTIME   zolpidem (AMBIEN) 10 MG tablet Take 5 mg by mouth at bedtime.   [DISCONTINUED] metoprolol succinate (TOPROL XL) 25 MG 24 hr tablet Take 1 tablet (25 mg total) by mouth daily. (Patient taking differently: Take 12.5 mg by mouth daily.)     PAST MEDICAL HISTORY: Past Medical History:  Diagnosis Date   High cholesterol    Hypertension    Insomnia    Kidney stone     PAST SURGICAL HISTORY: Past Surgical History:  Procedure Laterality Date   ANKLE SURGERY     LEFT HEART CATH AND CORONARY ANGIOGRAPHY N/A 11/05/2022   Procedure: LEFT HEART CATH AND CORONARY ANGIOGRAPHY;  Surgeon: Yates Decamp, MD;  Location: [facility];  Service: Cardiovascular;  Laterality: N/A;   SHOULDER SURGERY  1975    FAMILY HISTORY: The patient family history includes Diabetes in his father; Heart attack in his father; Heart disease in his father; Hypertension in his father.  SOCIAL HISTORY:  The patient  reports that he has never smoked. He has never used smokeless tobacco. He reports that he does not drink alcohol and does not use drugs.  REVIEW OF SYSTEMS: Review of Systems  Cardiovascular:  Negative for chest pain, claudication, dyspnea on exertion, irregular heartbeat, leg swelling, near-syncope, orthopnea, palpitations, paroxysmal nocturnal dyspnea and syncope.  Respiratory:  Negative for shortness of breath.   Hematologic/Lymphatic:  Negative for bleeding problem.  Musculoskeletal:  Negative for muscle cramps and myalgias.  Neurological:  Negative for dizziness and light-headedness.    PHYSICAL EXAM:    03/01/2023    3:14 PM 03/01/2023    2:35 PM 12/02/2022    9:42 AM  Vitals with BMI  Height  5\' 9"  5\' 9"   Weight  175 lbs 3 oz 179 lbs 10 oz  BMI  25.86 26.51  Systolic 154 172 469  Diastolic 80 69 84  Pulse 60 61 61    Physical Exam  Constitutional: No distress.  Age appropriate, hemodynamically stable.   Neck: No JVD present.  Cardiovascular: Normal rate, regular rhythm, S1 normal, S2 normal, intact distal pulses and normal pulses. Exam reveals no gallop, no S3 and no S4.  No murmur heard. Pulmonary/Chest: Effort normal and breath sounds normal. No stridor. He has no wheezes. He has no rales.  Abdominal: Soft. Bowel sounds are normal. He exhibits no distension. There is no abdominal tenderness.  Musculoskeletal:        General: No edema.     Cervical back: Neck supple.  Neurological: He is alert and oriented to person, place, and time. He has intact cranial nerves (2-12).  Skin: Skin is warm and moist.    LABORATORY DATA:    Latest Ref Rng & Units 10/22/2022  8:50 AM 04/27/2013    8:50 AM 07/21/2006   10:42 AM  CBC  WBC 3.4 - 10.8 x10E3/uL 3.8  9.0  4.5   Hemoglobin 13.0 - 17.7 g/dL 09.8  11.9  14.7   Hematocrit 37.5 - 51.0 % 39.3  37.2  38.4   Platelets 150 - 450 x10E3/uL 249  257  282        Latest Ref Rng & Units 10/22/2022    8:50 AM 04/27/2013    8:50 AM  CMP  Glucose 70 - 99 mg/dL 92  829   BUN 8 - 27 mg/dL 25  25   Creatinine 5.62 - 1.27 mg/dL 1.30  8.65   Sodium 784 - 144 mmol/L 139  136   Potassium 3.5 - 5.2 mmol/L 4.8  4.0   Chloride 96 - 106 mmol/L 104  102   CO2 20 - 29 mmol/L 21  25   Calcium 8.6 - 10.2 mg/dL 9.7  9.3   Total Protein 6.0 - 8.3 g/dL  8.7   Total Bilirubin 0.3 - 1.2 mg/dL  0.3   Alkaline Phos 39 - 117 U/L  55   AST 0 - 37 U/L  25   ALT 0 - 53 U/L  36     Lab  Results  Component Value Date   CHOL 171 10/22/2022   HDL 50 10/22/2022   LDLCALC 99 10/22/2022   TRIG 121 10/22/2022   No components found for: "NTPROBNP" No results for input(s): "PROBNP" in the last 8760 hours. No results for input(s): "TSH" in the last 8760 hours.  BMP Recent Labs    10/22/22 0850  NA 139  K 4.8  CL 104  CO2 21  GLUCOSE 92  BUN 25  CREATININE 1.27  CALCIUM 9.7    HEMOGLOBIN A1C No results found for: "HGBA1C", "MPG"  IMPRESSION:    ICD-10-CM   1. Essential hypertension  I10     2. Mixed hyperlipidemia  E78.2     3. Nonobstructive atherosclerosis of coronary artery  I25.10        RECOMMENDATIONS: JAXEN MCATEER is a 72 y.o. Caucasian male whose past medical history and cardiac risk factors include: Hypertension, hyperlipidemia, nonobstructive CAD.  Essential hypertension Office blood pressures are not at goal, he has a history of whitecoat hypertension Home blood pressure log reviewed which is very well-controlled.  I have asked him to continue to check his blood pressures on a regular basis and if SBP is less than 130 mmHg we will hold off on medical therapy. As he is concerned that meds will lower his BP too low and he is active (walks one mile on treadmill daily and plays two ball games).  For now I have refilled his lisinopril to 5 mg p.o. every afternoon. He is more than welcome to call the office for further guidance if any questions arise. His wife was present during the discussion. Home blood pressure log scanned into the media section  Mixed hyperlipidemia Continue current statin therapy. No myalgias reported.  Nonobstructive atherosclerosis of coronary artery Denies anginal chest pain. Had heart catheterization in April 2024 was noted to have disease in the LAD which is currently being treated medically. No additional cardiovascular testing warranted at this time. He has excellent functional capacity for age. Reemphasized the  importance of secondary prevention with focus on improving her modifiable cardiovascular risk factors such as glycemic control, lipid management, blood pressure control, weight loss.  FINAL MEDICATION LIST END OF ENCOUNTER:  No orders of the defined types were placed in this encounter.   Medications Discontinued During This Encounter  Medication Reason   Naphazoline-Pheniramine (VISINE OP)    nitroGLYCERIN (NITROSTAT) 0.4 MG SL tablet    Omega-3 Fatty Acids (FISH OIL) 1200 MG CAPS    Trolamine Salicylate (BLUE-EMU MAXIMUM PAIN RELIEF EX)    metoprolol succinate (TOPROL XL) 25 MG 24 hr tablet      Current Outpatient Medications:    aspirin EC 81 MG tablet, Take 1 tablet (81 mg total) by mouth daily., Disp: 90 tablet, Rfl: 3   Coenzyme Q10 (COQ10) 400 MG CAPS, Take 400 mg by mouth daily., Disp: , Rfl:    levothyroxine (SYNTHROID) 25 MCG tablet, Take 25 mcg by mouth daily., Disp: , Rfl:    lisinopril (ZESTRIL) 5 MG tablet, Take 5 mg by mouth daily at 10 pm., Disp: , Rfl:    Multiple Vitamin (MULTIVITAMIN WITH MINERALS) TABS tablet, Take 1 tablet by mouth daily., Disp: , Rfl:    rosuvastatin (CRESTOR) 20 MG tablet, TAKE ONE TABLET BY MOUTH EVERYDAY AT BEDTIME, Disp: 90 tablet, Rfl: 1   zolpidem (AMBIEN) 10 MG tablet, Take 5 mg by mouth at bedtime., Disp: , Rfl:   No orders of the defined types were placed in this encounter.   There are no Patient Instructions on file for this visit.   --Continue cardiac medications as reconciled in final medication list. --Return in about 9 months (around 12/05/2023) for Follow up non-obstructive CAD and BP . or sooner if needed. --Continue follow-up with your primary care physician regarding the management of your other chronic comorbid conditions.  Patient's questions and concerns were addressed to his satisfaction. He voices understanding of the instructions provided during this encounter.   This note was created using a voice recognition software as a  result there may be grammatical errors inadvertently enclosed that do not reflect the nature of this encounter. Every attempt is made to correct such errors.  Tessa Lerner, [place], [facility]  Pager:  [phone] Office: [phone]

## 2023-03-07 DIAGNOSIS — I10 Essential (primary) hypertension: Secondary | ICD-10-CM | POA: Diagnosis not present

## 2023-03-07 DIAGNOSIS — R310 Gross hematuria: Secondary | ICD-10-CM | POA: Diagnosis not present

## 2023-04-18 DIAGNOSIS — Z23 Encounter for immunization: Secondary | ICD-10-CM | POA: Diagnosis not present

## 2023-05-11 DIAGNOSIS — R310 Gross hematuria: Secondary | ICD-10-CM | POA: Diagnosis not present

## 2023-05-11 DIAGNOSIS — N400 Enlarged prostate without lower urinary tract symptoms: Secondary | ICD-10-CM | POA: Diagnosis not present

## 2023-06-02 DIAGNOSIS — E7800 Pure hypercholesterolemia, unspecified: Secondary | ICD-10-CM | POA: Diagnosis not present

## 2023-06-02 DIAGNOSIS — I10 Essential (primary) hypertension: Secondary | ICD-10-CM | POA: Diagnosis not present

## 2023-06-02 DIAGNOSIS — E039 Hypothyroidism, unspecified: Secondary | ICD-10-CM | POA: Diagnosis not present

## 2023-06-02 DIAGNOSIS — R7303 Prediabetes: Secondary | ICD-10-CM | POA: Diagnosis not present

## 2023-06-02 DIAGNOSIS — Z1211 Encounter for screening for malignant neoplasm of colon: Secondary | ICD-10-CM | POA: Diagnosis not present

## 2023-06-02 DIAGNOSIS — F5101 Primary insomnia: Secondary | ICD-10-CM | POA: Diagnosis not present

## 2023-06-03 ENCOUNTER — Ambulatory Visit: Payer: PPO | Admitting: Cardiology

## 2023-06-10 DIAGNOSIS — R160 Hepatomegaly, not elsewhere classified: Secondary | ICD-10-CM | POA: Diagnosis not present

## 2023-06-10 DIAGNOSIS — N281 Cyst of kidney, acquired: Secondary | ICD-10-CM | POA: Diagnosis not present

## 2023-06-10 DIAGNOSIS — N200 Calculus of kidney: Secondary | ICD-10-CM | POA: Diagnosis not present

## 2023-06-10 DIAGNOSIS — R310 Gross hematuria: Secondary | ICD-10-CM | POA: Diagnosis not present

## 2023-06-10 DIAGNOSIS — K769 Liver disease, unspecified: Secondary | ICD-10-CM | POA: Diagnosis not present

## 2023-06-27 DIAGNOSIS — L820 Inflamed seborrheic keratosis: Secondary | ICD-10-CM | POA: Diagnosis not present

## 2023-06-27 DIAGNOSIS — L300 Nummular dermatitis: Secondary | ICD-10-CM | POA: Diagnosis not present

## 2023-06-27 DIAGNOSIS — L578 Other skin changes due to chronic exposure to nonionizing radiation: Secondary | ICD-10-CM | POA: Diagnosis not present

## 2023-06-27 DIAGNOSIS — L821 Other seborrheic keratosis: Secondary | ICD-10-CM | POA: Diagnosis not present

## 2023-07-05 DIAGNOSIS — R310 Gross hematuria: Secondary | ICD-10-CM | POA: Diagnosis not present

## 2023-07-05 DIAGNOSIS — N200 Calculus of kidney: Secondary | ICD-10-CM | POA: Diagnosis not present

## 2023-07-08 ENCOUNTER — Other Ambulatory Visit: Payer: Self-pay | Admitting: Urology

## 2023-07-08 DIAGNOSIS — D134 Benign neoplasm of liver: Secondary | ICD-10-CM

## 2023-08-20 ENCOUNTER — Ambulatory Visit
Admission: RE | Admit: 2023-08-20 | Discharge: 2023-08-20 | Disposition: A | Discharge status: Home / Self Care | Payer: PPO | Source: Ambulatory Visit | Attending: Urology | Admitting: Urology

## 2023-08-20 DIAGNOSIS — D134 Benign neoplasm of liver: Secondary | ICD-10-CM

## 2023-08-20 MED ORDER — GADOPICLENOL 0.5 MMOL/ML IV SOLN
10.0000 mL | Freq: Once | INTRAVENOUS | Status: AC | PRN
  Administered 2023-08-20: 8 mL via INTRAVENOUS

## 2023-08-23 ENCOUNTER — Other Ambulatory Visit: Payer: Self-pay

## 2023-08-23 MED ORDER — ROSUVASTATIN CALCIUM 20 MG PO TABS: 20.0000 mg | ORAL_TABLET | Freq: Every day | ORAL | 2 refills | Status: AC

## 2023-12-16 DIAGNOSIS — F5101 Primary insomnia: Secondary | ICD-10-CM | POA: Diagnosis not present

## 2023-12-16 DIAGNOSIS — Z0000 Encounter for general adult medical examination without abnormal findings: Secondary | ICD-10-CM | POA: Diagnosis not present

## 2023-12-16 DIAGNOSIS — Z125 Encounter for screening for malignant neoplasm of prostate: Secondary | ICD-10-CM | POA: Diagnosis not present

## 2023-12-16 DIAGNOSIS — E039 Hypothyroidism, unspecified: Secondary | ICD-10-CM | POA: Diagnosis not present

## 2023-12-16 DIAGNOSIS — R7303 Prediabetes: Secondary | ICD-10-CM | POA: Diagnosis not present

## 2023-12-16 DIAGNOSIS — D538 Other specified nutritional anemias: Secondary | ICD-10-CM | POA: Diagnosis not present

## 2023-12-16 DIAGNOSIS — I10 Essential (primary) hypertension: Secondary | ICD-10-CM | POA: Diagnosis not present

## 2023-12-16 DIAGNOSIS — E7800 Pure hypercholesterolemia, unspecified: Secondary | ICD-10-CM | POA: Diagnosis not present

## 2023-12-30 ENCOUNTER — Ambulatory Visit: Payer: Self-pay | Admitting: Cardiology

## 2023-12-30 ENCOUNTER — Encounter: Payer: Self-pay | Admitting: Cardiology

## 2023-12-30 ENCOUNTER — Ambulatory Visit: Attending: Cardiology | Admitting: Cardiology

## 2023-12-30 VITALS — BP 142/70 | HR 55 | Resp 16 | Ht 69.0 in | Wt 172.6 lb

## 2023-12-30 DIAGNOSIS — I2510 Atherosclerotic heart disease of native coronary artery without angina pectoris: Secondary | ICD-10-CM

## 2023-12-30 DIAGNOSIS — I10 Essential (primary) hypertension: Secondary | ICD-10-CM | POA: Diagnosis not present

## 2023-12-30 DIAGNOSIS — E782 Mixed hyperlipidemia: Secondary | ICD-10-CM

## 2023-12-30 NOTE — Patient Instructions (Signed)
 Medication Instructions:  No Changes *If you need a refill on your cardiac medications before your next appointment, please call your pharmacy*  Lab Work: None  Follow-Up: At [facility], you and your health needs are our priority.  As part of our continuing mission to provide you with exceptional heart care, our providers are all part of one team.  This team includes your primary Cardiologist (physician) and Advanced Practice Providers or APPs (Physician Assistants and Nurse Practitioners) who all work together to provide you with the care you need, when you need it.  Your next appointment:   2 year(s)  Provider:   Olinda Bertrand, DO   Other Instructions Please call us  or send a MyChart message with any Cardiology related questions/concerns.  [phone].  Thank you!

## 2023-12-30 NOTE — Progress Notes (Signed)
 Cardiology Office Note:  .   Date:  12/30/2023  ID:  Cory Reid, DOB 05/10/1951, MRN [id number] PCP:  Roselind Congo, MD  Former Cardiology Providers: Dr. Isabell Manzanilla  [facility] Providers Cardiologist:  Olinda Bertrand, DO , [facility] (established care 03/01/2023) Electrophysiologist:  None  Click to update primary MD,subspecialty MD or APP then REFRESH:1}    Chief Complaint  Patient presents with   Hypertension   Follow-up    History of Present Illness: .   Cory Reid is a 73 y.o. Caucasian male whose past medical history and cardiovascular risk factors includes: Hypertension, hyperlipidemia, nonobstructive CAD.   Patient was formally seen Dr. Custovic and establish care with myself back in 2024.  Prior to seeing me he had undergone a thorough cardiovascular workup.  At the last office visit patient endorsed that at times his blood pressures were not well-controlled with SBP as high as 150-160 mmHg.  For reasons unknown lisinopril  was discontinued and placed on metoprolol  succinate.  He was taking at times half a tablet or quarter of a tablet.  At the last office visit his metoprolol  succinate 25 mg p.o. daily was discontinued and was started on low-dose lisinopril  5 mg p.o. daily.  Patient did not want to uptitrate medical therapy as he was concerned that it would may overcorrect his blood pressures.  Since last office visit patient denies anginal chest pain or heart failure symptoms. He has been checking his blood pressures at home and SBP is consistently <130 mmHg while taking lisinopril  5 mg p.o. daily.  Please see media section to review his blood pressure log. He continues to play travel softball has not had any exertional symptoms or change in endurance. Walks 2 miles per day.   Review of Systems: .   Review of Systems  Cardiovascular:  Negative for chest pain, claudication, irregular heartbeat, leg swelling, near-syncope, orthopnea, palpitations,  paroxysmal nocturnal dyspnea and syncope.  Respiratory:  Negative for shortness of breath.   Hematologic/Lymphatic: Negative for bleeding problem.    Studies Reviewed:   EKG: EKG Interpretation Date/Time:  Friday December 30 2023 08:57:10 EDT Ventricular Rate:  54 PR Interval:  224 QRS Duration:  88 QT Interval:  414 QTC Calculation: 392 R Axis:   22  Text Interpretation: Sinus bradycardia with 1st degree A-V block with Premature atrial complexes When compared with ECG of 05-Nov-2022 06:03, Premature atrial complexes are now Present Confirmed by Olinda Bertrand [phone]) on 12/30/2023 9:24:35 AM  Echocardiogram: 09/14/2022: Normal LV systolic function with visual EF 55-60%. Left ventricle cavity is normal in size. Normal left ventricular wall thickness. Normal global wall motion. Normal diastolic filling pattern. Calculated EF 62%. Structurally normal tricuspid valve with trace regurgitation. No evidence of pulmonary hypertension. No prior available for comparison.   Left Heart Catheterization 11/05/22:  LV: 137/7, EDP 18 mmHg.  Ao 146/66, mean 98 mmHg.  No pressure gradient of aortic valve. RCA: Dominant, minimal disease. LM: Large-caliber vessel, mildly calcified. LAD: Moderate caliber vessel, diffuse mild coronary calcification is evident.  Mid segment has a 50 to 60% stenosis. LCx: Large-caliber vessel, gives origin to small marginals.  Has mild diffuse calcification and mild luminal irregularity in the proximal segment.     Impression: Mild inferior ischemia could be in the distribution of the mid LAD stenosis however the lesion is mildly calcified and very stable.  Since being on beta-blocker therapy and also high intensity statins, patient states that he has started to feel better and he  has not had any further chest pain.  Hence continued medical therapy is indicated.  No restrictions from cardiac standpoint.  RADIOLOGY: NA  Risk Assessment/Calculations:   NA   Labs:        Latest Ref Rng & Units 10/22/2022    8:50 AM 04/27/2013    8:50 AM 07/21/2006   10:42 AM  CBC  WBC 3.4 - 10.8 x10E3/uL 3.8  9.0  4.5   Hemoglobin 13.0 - 17.7 g/dL 16.1  09.6  04.5   Hematocrit 37.5 - 51.0 % 39.3  37.2  38.4   Platelets 150 - 450 x10E3/uL 249  257  282        Latest Ref Rng & Units 10/22/2022    8:50 AM 04/27/2013    8:50 AM  BMP  Glucose 70 - 99 mg/dL 92  409   BUN 8 - 27 mg/dL 25  25   Creatinine 8.11 - 1.27 mg/dL 9.14  7.82   BUN/Creat Ratio 10 - 24 20    Sodium 134 - 144 mmol/L 139  136   Potassium 3.5 - 5.2 mmol/L 4.8  4.0   Chloride 96 - 106 mmol/L 104  102   CO2 20 - 29 mmol/L 21  25   Calcium  8.6 - 10.2 mg/dL 9.7  9.3       Latest Ref Rng & Units 10/22/2022    8:50 AM 04/27/2013    8:50 AM  CMP  Glucose 70 - 99 mg/dL 92  956   BUN 8 - 27 mg/dL 25  25   Creatinine 2.13 - 1.27 mg/dL 0.86  5.78   Sodium 469 - 144 mmol/L 139  136   Potassium 3.5 - 5.2 mmol/L 4.8  4.0   Chloride 96 - 106 mmol/L 104  102   CO2 20 - 29 mmol/L 21  25   Calcium  8.6 - 10.2 mg/dL 9.7  9.3   Total Protein 6.0 - 8.3 g/dL  8.7   Total Bilirubin 0.3 - 1.2 mg/dL  0.3   Alkaline Phos 39 - 117 U/L  55   AST 0 - 37 U/L  25   ALT 0 - 53 U/L  36     Lab Results  Component Value Date   CHOL 171 10/22/2022   HDL 50 10/22/2022   LDLCALC 99 10/22/2022   TRIG 121 10/22/2022   No results for input(s): LIPOA in the last 8760 hours. No components found for: NTPROBNP No results for input(s): PROBNP in the last 8760 hours. No results for input(s): TSH in the last 8760 hours.  External Labs: Collected: Dec 16 2023 [facility] physicians. Total cholesterol 133, triglycerides 89, HDL 48, LDL 68, BUN 22, creatinine 1.08. Potassium 5. AST, ALT, alkaline phosphatase within normal limits. A1c 6.2 Hemoglobin 11.2  Physical Exam:    Today's Vitals   12/30/23 0854 12/30/23 0924  BP: (!) 146/85 (!) 142/70  Pulse: (!) 55   Resp: 16   SpO2: 98%   Weight: 172 lb 9.6 oz (78.3 kg)    Height: 5' 9 (1.753 m)    Body mass index is 25.49 kg/m. Wt Readings from Last 3 Encounters:  12/30/23 172 lb 9.6 oz (78.3 kg)  03/01/23 175 lb 3.2 oz (79.5 kg)  12/02/22 179 lb 9.6 oz (81.5 kg)    Physical Exam  Constitutional: No distress.  hemodynamically stable  Neck: No JVD present.  Cardiovascular: Normal rate, regular rhythm, S1 normal and S2 normal. Exam reveals no gallop, no S3  and no S4.  No murmur heard. Pulmonary/Chest: Effort normal and breath sounds normal. No stridor. He has no wheezes. He has no rales.  Musculoskeletal:        General: No edema.     Cervical back: Neck supple.  Skin: Skin is warm.    Impression & Recommendation(s):  Impression:   ICD-10-CM   1. Essential hypertension  I10 EKG 12-Lead    2. Mixed hyperlipidemia  E78.2     3. Nonobstructive atherosclerosis of coronary artery  I25.10        Recommendation(s):  Essential hypertension Office blood pressures are not at goal. Home blood pressure log since January 2025 reviewed and SBP is consistently less than 130 mmHg on visual estimation. Continue lisinopril  5 mg p.o. daily. Reemphasized importance of low-salt diet.  Mixed hyperlipidemia Currently on Crestor  20 mg p.o. nightly.  Outside labs provide with the patient performed at [facility] physicians noted LDL of 68 mg/dL as of Dec 16, 2023.  Nonobstructive atherosclerosis of coronary artery Denies anginal chest pain. EKG is nonischemic. Physically active without exertional chest pain or decrease in endurance. Reemphasized the importance of secondary prevention with focus on improving the modifiable cardiovascular risk factors such as glycemic control, lipid management, blood pressure control.  Orders Placed:  Orders Placed This Encounter  Procedures   EKG 12-Lead   Overall stable from a cardiovascular standpoint and recommended follow-up on as needed basis. However, patient patient endorses that he would feel more comfortable sleeping  his regular annual follow-up visits.  Shared decision was to see him back in 2 years or sooner if needed.  Patient agreeable with the plan of care.  Discussed management of at least 2 chronic comorbid conditions, prescription drug management, outside labs from [place] physicians on Dec 16, 2023 independently reviewed and mentioned above, blood pressure log reviewed, coordination of care discussed.  Final Medication List:   No orders of the defined types were placed in this encounter.   There are no discontinued medications.   Current Outpatient Medications:    aspirin  EC 81 MG tablet, Take 1 tablet (81 mg total) by mouth daily., Disp: 90 tablet, Rfl: 3   Coenzyme Q10 (COQ10) 400 MG CAPS, Take 400 mg by mouth daily., Disp: , Rfl:    levothyroxine (SYNTHROID) 25 MCG tablet, Take 25 mcg by mouth daily., Disp: , Rfl:    lisinopril  (ZESTRIL ) 5 MG tablet, Take 5 mg by mouth daily at 10 pm., Disp: , Rfl:    Multiple Vitamin (MULTIVITAMIN WITH MINERALS) TABS tablet, Take 1 tablet by mouth daily., Disp: , Rfl:    rosuvastatin  (CRESTOR ) 20 MG tablet, Take 1 tablet (20 mg total) by mouth daily., Disp: 90 tablet, Rfl: 2   zolpidem (AMBIEN) 10 MG tablet, Take 5 mg by mouth at bedtime., Disp: , Rfl:   Consent:   NA  Disposition:   2-year follow-up sooner if needed  His questions and concerns were addressed to his satisfaction. He voices understanding of the recommendations provided during this encounter.    Signed, Awilda Bogus, [facility]  A Division of [facility] [address], [place], [place] [address]  [place], [place] [address] 12/30/2023 9:48 AM

## 2024-01-19 DIAGNOSIS — H40013 Open angle with borderline findings, low risk, bilateral: Secondary | ICD-10-CM | POA: Diagnosis not present

## 2024-03-07 DIAGNOSIS — M722 Plantar fascial fibromatosis: Secondary | ICD-10-CM | POA: Diagnosis not present

## 2024-03-07 DIAGNOSIS — M24572 Contracture, left ankle: Secondary | ICD-10-CM | POA: Diagnosis not present

## 2024-05-07 DIAGNOSIS — Z23 Encounter for immunization: Secondary | ICD-10-CM | POA: Diagnosis not present

## 2024-06-01 DIAGNOSIS — R7303 Prediabetes: Secondary | ICD-10-CM | POA: Diagnosis not present

## 2024-06-01 DIAGNOSIS — Z23 Encounter for immunization: Secondary | ICD-10-CM | POA: Diagnosis not present

## 2024-06-01 DIAGNOSIS — E039 Hypothyroidism, unspecified: Secondary | ICD-10-CM | POA: Diagnosis not present

## 2024-06-01 DIAGNOSIS — E7800 Pure hypercholesterolemia, unspecified: Secondary | ICD-10-CM | POA: Diagnosis not present

## 2024-06-01 DIAGNOSIS — M79672 Pain in left foot: Secondary | ICD-10-CM | POA: Diagnosis not present

## 2024-06-01 DIAGNOSIS — F5101 Primary insomnia: Secondary | ICD-10-CM | POA: Diagnosis not present

## 2024-06-01 DIAGNOSIS — D538 Other specified nutritional anemias: Secondary | ICD-10-CM | POA: Diagnosis not present

## 2024-06-01 DIAGNOSIS — I10 Essential (primary) hypertension: Secondary | ICD-10-CM | POA: Diagnosis not present

## 2024-06-27 ENCOUNTER — Encounter (HOSPITAL_BASED_OUTPATIENT_CLINIC_OR_DEPARTMENT_OTHER): Payer: Self-pay | Admitting: Emergency Medicine

## 2024-06-27 ENCOUNTER — Emergency Department (HOSPITAL_BASED_OUTPATIENT_CLINIC_OR_DEPARTMENT_OTHER)

## 2024-06-27 ENCOUNTER — Other Ambulatory Visit: Payer: Self-pay

## 2024-06-27 ENCOUNTER — Emergency Department (HOSPITAL_BASED_OUTPATIENT_CLINIC_OR_DEPARTMENT_OTHER)
Admission: EM | Admit: 2024-06-27 | Discharge: 2024-06-27 | Disposition: A | Discharge status: Home / Self Care | Attending: Emergency Medicine | Admitting: Emergency Medicine

## 2024-06-27 DIAGNOSIS — R7989 Other specified abnormal findings of blood chemistry: Secondary | ICD-10-CM | POA: Diagnosis not present

## 2024-06-27 DIAGNOSIS — D649 Anemia, unspecified: Secondary | ICD-10-CM | POA: Insufficient documentation

## 2024-06-27 DIAGNOSIS — N132 Hydronephrosis with renal and ureteral calculous obstruction: Secondary | ICD-10-CM | POA: Diagnosis not present

## 2024-06-27 DIAGNOSIS — K5730 Diverticulosis of large intestine without perforation or abscess without bleeding: Secondary | ICD-10-CM | POA: Diagnosis not present

## 2024-06-27 DIAGNOSIS — R1011 Right upper quadrant pain: Secondary | ICD-10-CM | POA: Diagnosis present

## 2024-06-27 DIAGNOSIS — Z7982 Long term (current) use of aspirin: Secondary | ICD-10-CM | POA: Insufficient documentation

## 2024-06-27 DIAGNOSIS — R109 Unspecified abdominal pain: Secondary | ICD-10-CM | POA: Diagnosis not present

## 2024-06-27 DIAGNOSIS — N200 Calculus of kidney: Secondary | ICD-10-CM

## 2024-06-27 DIAGNOSIS — N1330 Unspecified hydronephrosis: Secondary | ICD-10-CM | POA: Diagnosis not present

## 2024-06-27 DIAGNOSIS — K769 Liver disease, unspecified: Secondary | ICD-10-CM | POA: Diagnosis not present

## 2024-06-27 DIAGNOSIS — N281 Cyst of kidney, acquired: Secondary | ICD-10-CM | POA: Diagnosis not present

## 2024-06-27 LAB — CBC WITH DIFFERENTIAL/PLATELET
Abs Immature Granulocytes: 0.01 K/uL (ref 0.00–0.07)
Basophils Absolute: 0 K/uL (ref 0.0–0.1)
Basophils Relative: 0 %
Eosinophils Absolute: 0 K/uL (ref 0.0–0.5)
Eosinophils Relative: 1 %
HCT: 34.3 % — ABNORMAL LOW (ref 39.0–52.0)
Hemoglobin: 11.4 g/dL — ABNORMAL LOW (ref 13.0–17.0)
Immature Granulocytes: 0 %
Lymphocytes Relative: 15 %
Lymphs Abs: 0.9 K/uL (ref 0.7–4.0)
MCH: 32.3 pg (ref 26.0–34.0)
MCHC: 33.2 g/dL (ref 30.0–36.0)
MCV: 97.2 fL (ref 80.0–100.0)
Monocytes Absolute: 0.7 K/uL (ref 0.1–1.0)
Monocytes Relative: 12 %
Neutro Abs: 4.3 K/uL (ref 1.7–7.7)
Neutrophils Relative %: 72 %
Platelets: 196 K/uL (ref 150–400)
RBC: 3.53 MIL/uL — ABNORMAL LOW (ref 4.22–5.81)
RDW: 13.2 % (ref 11.5–15.5)
WBC: 6 K/uL (ref 4.0–10.5)
nRBC: 0 % (ref 0.0–0.2)

## 2024-06-27 LAB — COMPREHENSIVE METABOLIC PANEL WITH GFR
ALT: 30 U/L (ref 0–44)
AST: 26 U/L (ref 15–41)
Albumin: 4.1 g/dL (ref 3.5–5.0)
Alkaline Phosphatase: 58 U/L (ref 38–126)
Anion gap: 9 (ref 5–15)
BUN: 23 mg/dL (ref 8–23)
CO2: 25 mmol/L (ref 22–32)
Calcium: 9.2 mg/dL (ref 8.9–10.3)
Chloride: 104 mmol/L (ref 98–111)
Creatinine, Ser: 1.34 mg/dL — ABNORMAL HIGH (ref 0.61–1.24)
GFR, Estimated: 56 mL/min — ABNORMAL LOW (ref >60)
Glucose, Bld: 107 mg/dL — ABNORMAL HIGH (ref 70–99)
Potassium: 4.2 mmol/L (ref 3.5–5.1)
Sodium: 138 mmol/L (ref 135–145)
Total Bilirubin: 0.4 mg/dL (ref 0.0–1.2)
Total Protein: 8.4 g/dL — ABNORMAL HIGH (ref 6.5–8.1)

## 2024-06-27 LAB — URINALYSIS, ROUTINE W REFLEX MICROSCOPIC
Bilirubin Urine: NEGATIVE
Glucose, UA: NEGATIVE mg/dL
Ketones, ur: NEGATIVE mg/dL
Leukocytes,Ua: NEGATIVE
Nitrite: NEGATIVE
Protein, ur: 100 mg/dL — AB
Specific Gravity, Urine: 1.015 (ref 1.005–1.030)
pH: 6.5 (ref 5.0–8.0)

## 2024-06-27 LAB — URINALYSIS, MICROSCOPIC (REFLEX)

## 2024-06-27 LAB — LIPASE, BLOOD: Lipase: 23 U/L (ref 11–51)

## 2024-06-27 MED ORDER — HYDROCODONE-ACETAMINOPHEN 5-325 MG PO TABS: 1.0000 | ORAL_TABLET | ORAL | 0 refills | Status: DC | PRN

## 2024-06-27 MED ORDER — KETOROLAC TROMETHAMINE 15 MG/ML IJ SOLN
15.0000 mg | Freq: Once | INTRAMUSCULAR | Status: AC
  Administered 2024-06-27: 15 mg via INTRAMUSCULAR
  Filled 2024-06-27: qty 1

## 2024-06-27 MED ORDER — TAMSULOSIN HCL 0.4 MG PO CAPS
0.4000 mg | ORAL_CAPSULE | Freq: Once | ORAL | Status: AC
  Administered 2024-06-27: 0.4 mg via ORAL
  Filled 2024-06-27: qty 1

## 2024-06-27 MED ORDER — ONDANSETRON 4 MG PO TBDP: 4.0000 mg | ORAL_TABLET | Freq: Three times a day (TID) | ORAL | 0 refills | Status: AC | PRN

## 2024-06-27 MED ORDER — TAMSULOSIN HCL 0.4 MG PO CAPS: 0.4000 mg | ORAL_CAPSULE | Freq: Every day | ORAL | 0 refills | Status: AC

## 2024-06-27 NOTE — Discharge Instructions (Addendum)
 Today you were seen for right sided lung/abdominal pain.  You were found to have a kidney stone.  Please take your Flomax  as directed.  You may take Tylenol  and Motrin as needed for mild to moderate pain and Norco for severe pain.  You have also been given Zofran  for nausea and vomiting.  Please follow-up with Dr. Carolee of urology for further evaluation and workup.  Thank you for letting us  treat you today. After reviewing your labs and imaging, I feel you are safe to go home. Please follow up with your PCP in the next several days and provide them with your records from this visit. Return to the Emergency Room if pain becomes severe or symptoms worsen.

## 2024-06-27 NOTE — ED Provider Notes (Signed)
 [facility] AT [facility] Provider Note   CSN: [id number] Arrival date & time: 06/27/24  1739     Patient presents with: Abdominal Pain   Cory Reid is a 73 y.o. male presents today for right upper quadrant pain that started earlier today.  Patient denies fever, chill, nausea, vomiting, diarrhea, chest pain, shortness of breath, any other complaints at this time.  Patient now reporting that the pain is wrapping around to his right lower quadrant.  Patient denies any urinary symptoms at this time.    Abdominal Pain      Prior to Admission medications   Medication Sig Start Date End Date Taking? Authorizing Provider  HYDROcodone-acetaminophen  (NORCO/VICODIN) 5-325 MG tablet Take 1 tablet by mouth every 4 (four) hours as needed for severe pain (pain score 7-10). 06/27/24  Yes Francis Ileana SAILOR, PA-C  ondansetron  (ZOFRAN -ODT) 4 MG disintegrating tablet Take 1 tablet (4 mg total) by mouth every 8 (eight) hours as needed. 06/27/24  Yes Francis Ileana SAILOR, PA-C  tamsulosin  (FLOMAX ) 0.4 MG CAPS capsule Take 1 capsule (0.4 mg total) by mouth daily. 06/27/24  Yes Francis Ileana SAILOR, PA-C  aspirin  EC 81 MG tablet Take 1 tablet (81 mg total) by mouth daily. 10/21/22   Custovic, Sabina, DO  Coenzyme Q10 (COQ10) 400 MG CAPS Take 400 mg by mouth daily.    [provider]  levothyroxine (SYNTHROID) 25 MCG tablet Take 25 mcg by mouth daily. 08/25/22   [provider]  lisinopril  (ZESTRIL ) 5 MG tablet Take 5 mg by mouth daily at 10 pm.    [provider]  Multiple Vitamin (MULTIVITAMIN WITH MINERALS) TABS tablet Take 1 tablet by mouth daily.    [provider]  rosuvastatin  (CRESTOR ) 20 MG tablet Take 1 tablet (20 mg total) by mouth daily. 08/23/23   Tolia, Sunit, DO  zolpidem (AMBIEN) 10 MG tablet Take 5 mg by mouth at bedtime.    [provider]    Allergies: Penicillins and Erythromycin    Review of Systems  Gastrointestinal:   Positive for abdominal pain.    Updated Vital Signs BP (!) 160/86 (BP Location: Right Arm)   Pulse 60   Temp 98.2 F (36.8 C)   Resp 18   Ht 5' 9 (1.753 m)   Wt 78 kg   SpO2 99%   BMI 25.40 kg/m   Physical Exam Vitals and nursing note reviewed.  Constitutional:      General: He is not in acute distress.    Appearance: He is well-developed. He is not toxic-appearing.  HENT:     Head: Normocephalic and atraumatic.  Eyes:     Conjunctiva/sclera: Conjunctivae normal.  Cardiovascular:     Rate and Rhythm: Normal rate and regular rhythm.     Heart sounds: Normal heart sounds. No murmur heard. Pulmonary:     Effort: Pulmonary effort is normal. No respiratory distress.     Breath sounds: Normal breath sounds.  Abdominal:     General: There is no distension.     Palpations: Abdomen is soft.     Tenderness: There is abdominal tenderness in the right lower quadrant. There is right CVA tenderness. Negative signs include Murphy's sign.  Musculoskeletal:        General: No swelling.     Cervical back: Neck supple.  Skin:    General: Skin is warm and dry.     Capillary Refill: Capillary refill takes less than 2 seconds.  Neurological:  General: No focal deficit present.     Mental Status: He is alert and oriented to person, place, and time.  Psychiatric:        Mood and Affect: Mood normal.     (all labs ordered are listed, but only abnormal results are displayed) Labs Reviewed  COMPREHENSIVE METABOLIC PANEL WITH GFR - Abnormal; Notable for the following components:      Result Value   Glucose, Bld 107 (*)    Creatinine, Ser 1.34 (*)    Total Protein 8.4 (*)    GFR, Estimated 56 (*)    All other components within normal limits  URINALYSIS, ROUTINE W REFLEX MICROSCOPIC - Abnormal; Notable for the following components:   Hgb urine dipstick LARGE (*)    Protein, ur 100 (*)    All other components within normal limits  CBC WITH DIFFERENTIAL/PLATELET - Abnormal; Notable  for the following components:   RBC 3.53 (*)    Hemoglobin 11.4 (*)    HCT 34.3 (*)    All other components within normal limits  URINALYSIS, MICROSCOPIC (REFLEX) - Abnormal; Notable for the following components:   Bacteria, UA RARE (*)    All other components within normal limits  LIPASE, BLOOD    EKG: None  Radiology: CT Renal Stone Study Result Date: 06/27/2024 EXAM: CT ABDOMEN AND PELVIS WITHOUT CONTRAST 06/27/2024 07:39:54 PM TECHNIQUE: CT of the abdomen and pelvis was performed without the administration of intravenous contrast. Multiplanar reformatted images are provided for review. Automated exposure control, iterative reconstruction, and/or weight-based adjustment of the mA/kV was utilized to reduce the radiation dose to as low as reasonably achievable. COMPARISON: 06/10/2023 CLINICAL HISTORY: Abdominal/flank pain, stone suspected. FINDINGS: LOWER CHEST: No acute abnormality. LIVER: The liver is unremarkable. GALLBLADDER AND BILE DUCTS: Gallbladder is unremarkable. No biliary ductal dilatation. SPLEEN: No acute abnormality. PANCREAS: No acute abnormality. ADRENAL GLANDS: No acute abnormality. KIDNEYS, URETERS AND BLADDER: Moderate right hydronephrosis due to a 4 mm proximal right ureteral stone. Bilateral nephrolithiasis. The largest stone is in the right lower pole measuring 8 mm. Left upper pole renal cyst measures up to 4.8 cm with thin calcifications along the lower pole. This is unchanged since the prior study. Right upper pole renal cyst measures up to 6.5 cm and appears simple. This is unchanged since the prior study. Per consensus, no follow-up is needed for simple Bosniak type 1 and 2 renal cysts, unless the patient has a malignancy history or risk factors. No perinephric or periureteral stranding. Urinary bladder is unremarkable. GI AND BOWEL: Stomach demonstrates no acute abnormality. Colonic diverticulosis. No active diverticulitis. Normal appendix. There is no bowel  obstruction. PERITONEUM AND RETROPERITONEUM: No ascites. No free air. VASCULATURE: Aorta is normal in caliber. Aortic atherosclerosis. LYMPH NODES: No lymphadenopathy. REPRODUCTIVE ORGANS: No acute abnormality. BONES AND SOFT TISSUES: No acute osseous abnormality. No focal soft tissue abnormality. IMPRESSION: 1. Moderate right hydronephrosis due to a 4 mm proximal right ureteral stone. 2. Bilateral nephrolithiasis, largest 8 mm in the right lower pole. 3. Colonic diverticulosis without evidence of diverticulitis. Electronically signed by: Franky Crease MD 06/27/2024 07:48 PM EST RP Workstation: [id number]   US  Abdomen Limited RUQ (LIVER/GB) Result Date: 06/27/2024 EXAM: Right Upper Quadrant Abdominal Ultrasound 06/27/2024 07:01:58 PM TECHNIQUE: Real-time ultrasonography of the right upper quadrant of the abdomen was performed. COMPARISON: CT 06/10/2023. CLINICAL HISTORY: RUQ pain. FINDINGS: LIVER: Hyperechoic lesion in the left hepatic lobe measures 1.7 x 1.5 x 1.3 cm, most compatible with hemangioma. The liver demonstrates normal echogenicity.  No intrahepatic biliary ductal dilatation. BILIARY SYSTEM: No pericholecystic fluid or wall thickening. No cholelithiasis. Common bile duct is within normal limits measuring 5 mm. RIGHT KIDNEY: Right upper pole renal cyst measures 8 x 6.9 x 6.8 cm. Mild right hydronephrosis. OTHER: No right upper quadrant ascites. IMPRESSION: 1. No cholelithiasis or acute cholecystitis. 2. Mild right hydronephrosis. Electronically signed by: Franky Crease MD 06/27/2024 07:07 PM EST RP Workstation: [id number]     Procedures   Medications Ordered in the ED  tamsulosin  (FLOMAX ) capsule 0.4 mg (has no administration in time range)  ketorolac  (TORADOL ) 15 MG/ML injection 15 mg (15 mg Intramuscular Given 06/27/24 1928)                                    Medical Decision Making Amount and/or Complexity of Data Reviewed Labs: ordered. Radiology: ordered.  Risk Prescription drug  management.   lalThis patient presents to the ED for concern of abdominal pain differential diagnosis includes choledocholithiasis, acute cholecystitis, appendicitis, pancreatitis, SBO, diverticulitis, volvulus, viral GI illness   Lab Tests:  I Ordered, and personally interpreted labs.  The pertinent results include: Mild anemia at 11.4, mildly elevated creatinine at 1.34 from a baseline of approximately 1.25, lipase 23, urine with large hemoglobin, 100 protein, rare bacteria, 21-50 RBCs   Imaging Studies ordered:  I ordered imaging studies including right upper quadrant ultrasound I independently visualized and interpreted imaging which showed no cholelithiasis or acute cholecystitis.  Mild right hydronephrosis. I agree with the radiologist interpretation CT renal stone study: Moderate right hydronephrosis due to a 4 mm proximal right ureteral stone.   Medicines ordered and prescription drug management:  I ordered medication including Toradol , Flomax     I have reviewed the patients home medicines and have made adjustments as needed   Problem List / ED Course:  Considered for admission or further workup however patient's vital signs, physical exam, labs, and imaging are reassuring.  Patient symptoms likely due to right ureteral stone.  Patient given course of Flomax , antiemetics, and analgesics outpatient.  Patient given return precautions.  Patient to follow-up with urology for further evaluation and workup.  I feel patient safe for discharge at this time.      Final diagnoses:  Kidney stone    ED Discharge Orders          Ordered    tamsulosin  (FLOMAX ) 0.4 MG CAPS capsule  Daily        06/27/24 1954    HYDROcodone-acetaminophen  (NORCO/VICODIN) 5-325 MG tablet  Every 4 hours PRN        06/27/24 1954    ondansetron  (ZOFRAN -ODT) 4 MG disintegrating tablet  Every 8 hours PRN        06/27/24 1954               Francis Ileana SAILOR, PA-C 06/27/24 1955    Ruthe Cornet, DO 06/27/24 2026

## 2024-06-27 NOTE — ED Notes (Signed)
 Patient transported to CT

## 2024-06-27 NOTE — ED Triage Notes (Signed)
 Pt c/o RUQ pain that started today; denies NVD

## 2024-07-01 ENCOUNTER — Emergency Department (HOSPITAL_BASED_OUTPATIENT_CLINIC_OR_DEPARTMENT_OTHER)
Admission: EM | Admit: 2024-07-01 | Discharge: 2024-07-01 | Disposition: A | Discharge status: Home / Self Care | Attending: Emergency Medicine | Admitting: Emergency Medicine

## 2024-07-01 ENCOUNTER — Encounter (HOSPITAL_BASED_OUTPATIENT_CLINIC_OR_DEPARTMENT_OTHER): Payer: Self-pay

## 2024-07-01 DIAGNOSIS — Z87442 Personal history of urinary calculi: Secondary | ICD-10-CM | POA: Insufficient documentation

## 2024-07-01 DIAGNOSIS — R509 Fever, unspecified: Secondary | ICD-10-CM | POA: Insufficient documentation

## 2024-07-01 DIAGNOSIS — N23 Unspecified renal colic: Secondary | ICD-10-CM | POA: Insufficient documentation

## 2024-07-01 DIAGNOSIS — Z7982 Long term (current) use of aspirin: Secondary | ICD-10-CM | POA: Insufficient documentation

## 2024-07-01 DIAGNOSIS — K5900 Constipation, unspecified: Secondary | ICD-10-CM | POA: Insufficient documentation

## 2024-07-01 LAB — CBC
HCT: 31.7 % — ABNORMAL LOW (ref 39.0–52.0)
Hemoglobin: 10.6 g/dL — ABNORMAL LOW (ref 13.0–17.0)
MCH: 31.9 pg (ref 26.0–34.0)
MCHC: 33.4 g/dL (ref 30.0–36.0)
MCV: 95.5 fL (ref 80.0–100.0)
Platelets: 182 K/uL (ref 150–400)
RBC: 3.32 MIL/uL — ABNORMAL LOW (ref 4.22–5.81)
RDW: 12.9 % (ref 11.5–15.5)
WBC: 7.5 K/uL (ref 4.0–10.5)
nRBC: 0 % (ref 0.0–0.2)

## 2024-07-01 LAB — URINALYSIS, ROUTINE W REFLEX MICROSCOPIC
Bilirubin Urine: NEGATIVE
Glucose, UA: NEGATIVE mg/dL
Ketones, ur: NEGATIVE mg/dL
Leukocytes,Ua: NEGATIVE
Nitrite: NEGATIVE
Protein, ur: 100 mg/dL — AB
Specific Gravity, Urine: 1.01 (ref 1.005–1.030)
pH: 5.5 (ref 5.0–8.0)

## 2024-07-01 LAB — BASIC METABOLIC PANEL WITH GFR
Anion gap: 11 (ref 5–15)
BUN: 28 mg/dL — ABNORMAL HIGH (ref 8–23)
CO2: 24 mmol/L (ref 22–32)
Calcium: 9.4 mg/dL (ref 8.9–10.3)
Chloride: 99 mmol/L (ref 98–111)
Creatinine, Ser: 1.77 mg/dL — ABNORMAL HIGH (ref 0.61–1.24)
GFR, Estimated: 40 mL/min — ABNORMAL LOW (ref >60)
Glucose, Bld: 158 mg/dL — ABNORMAL HIGH (ref 70–99)
Potassium: 4.2 mmol/L (ref 3.5–5.1)
Sodium: 133 mmol/L — ABNORMAL LOW (ref 135–145)

## 2024-07-01 LAB — URINALYSIS, MICROSCOPIC (REFLEX)

## 2024-07-01 LAB — LACTIC ACID, PLASMA: Lactic Acid, Venous: 0.8 mmol/L (ref 0.5–1.9)

## 2024-07-01 MED ORDER — FENTANYL CITRATE (PF) 50 MCG/ML IJ SOSY
50.0000 ug | PREFILLED_SYRINGE | Freq: Once | INTRAMUSCULAR | Status: AC
  Administered 2024-07-01: 50 ug via INTRAVENOUS
  Filled 2024-07-01: qty 1

## 2024-07-01 MED ORDER — SODIUM CHLORIDE 0.9 % IV BOLUS
500.0000 mL | Freq: Once | INTRAVENOUS | Status: AC
  Administered 2024-07-01: 500 mL via INTRAVENOUS

## 2024-07-01 MED ORDER — KETOROLAC TROMETHAMINE 30 MG/ML IJ SOLN
15.0000 mg | Freq: Once | INTRAMUSCULAR | Status: AC
  Administered 2024-07-01: 15 mg via INTRAVENOUS
  Filled 2024-07-01: qty 1

## 2024-07-01 NOTE — Discharge Instructions (Signed)
 Please drink plenty of fluids.  Continue Flomax .  Hydrocodone  for significant pain.  Monitor for fever.  Follow-up with urology as scheduled.  Return if any worsening or concerning symptoms

## 2024-07-01 NOTE — ED Triage Notes (Signed)
 PT presents with a kidney stone that was found a couple days ago. States he hasn't passed the stone and has continued right flank pain.

## 2024-07-01 NOTE — ED Notes (Signed)
 Pt states he is unable to provide a urine sample at this time.

## 2024-07-01 NOTE — ED Provider Notes (Signed)
 [place] [facility] AT [facility] Provider Note   CSN: [id number] Arrival date & time: 07/01/24  9181     Patient presents with: Nephrolithiasis   Cory Reid is a 73 y.o. male.  He was seen here 4 days ago for some right sided abdominal pain.  Ultimately found to have 4 mm proximal right ureteral stone.  Discharged on Flomax , hydrocodone  after receiving some Toradol .  He said the pain recurred again last night and he had a tough night.  Right lower back and right lower quadrant.  Had a low-grade fever to 100.  No dysuria or hematuria.  Took half of Vicodin as was afraid might need to drive here.  Took some Tylenol  today.  Has an appointment with urology on Thursday.  Prior history of kidney stones.   The history is provided by the patient.  Abdominal Pain Pain location:  RLQ Pain quality: aching   Pain radiates to:  Back Pain severity:  Severe Onset quality:  Sudden Duration:  12 hours Timing:  Intermittent Progression:  Unchanged Chronicity:  Recurrent Relieved by:  Nothing Ineffective treatments:  Acetaminophen  Associated symptoms: constipation and fever   Associated symptoms: no chest pain, no chills, no cough, no diarrhea, no dysuria, no nausea, no shortness of breath and no vomiting        Prior to Admission medications  Medication Sig Start Date End Date Taking? Authorizing Provider  aspirin  EC 81 MG tablet Take 1 tablet (81 mg total) by mouth daily. 10/21/22   Custovic, Sabina, DO  Coenzyme Q10 (COQ10) 400 MG CAPS Take 400 mg by mouth daily.    [provider]  HYDROcodone -acetaminophen  (NORCO/VICODIN) 5-325 MG tablet Take 1 tablet by mouth every 4 (four) hours as needed for severe pain (pain score 7-10). 06/27/24   Keith, Kayla N, PA-C  levothyroxine (SYNTHROID) 25 MCG tablet Take 25 mcg by mouth daily. 08/25/22   [provider]  lisinopril  (ZESTRIL ) 5 MG tablet Take 5 mg by mouth daily at 10 pm.    [provider]   Multiple Vitamin (MULTIVITAMIN WITH MINERALS) TABS tablet Take 1 tablet by mouth daily.    [provider]  ondansetron  (ZOFRAN -ODT) 4 MG disintegrating tablet Take 1 tablet (4 mg total) by mouth every 8 (eight) hours as needed. 06/27/24   Keith, Kayla N, PA-C  rosuvastatin  (CRESTOR ) 20 MG tablet Take 1 tablet (20 mg total) by mouth daily. 08/23/23   Tolia, Sunit, DO  tamsulosin  (FLOMAX ) 0.4 MG CAPS capsule Take 1 capsule (0.4 mg total) by mouth daily. 06/27/24   Keith, Kayla N, PA-C  zolpidem (AMBIEN) 10 MG tablet Take 5 mg by mouth at bedtime.    [provider]    Allergies: Penicillins and Erythromycin    Review of Systems  Constitutional:  Positive for fever. Negative for chills.  Respiratory:  Negative for cough and shortness of breath.   Cardiovascular:  Negative for chest pain.  Gastrointestinal:  Positive for abdominal pain and constipation. Negative for diarrhea, nausea and vomiting.  Genitourinary:  Negative for dysuria.    Updated Vital Signs BP (!) 145/80   Pulse 73   Temp 98.6 F (37 C) (Oral)   Resp 16   SpO2 96%   Physical Exam Vitals and nursing note reviewed.  Constitutional:      General: He is not in acute distress.    Appearance: Normal appearance. He is well-developed.  HENT:     Head: Normocephalic and atraumatic.  Eyes:  Conjunctiva/sclera: Conjunctivae normal.  Cardiovascular:     Rate and Rhythm: Normal rate and regular rhythm.     Heart sounds: No murmur heard. Pulmonary:     Effort: Pulmonary effort is normal. No respiratory distress.     Breath sounds: Normal breath sounds.  Abdominal:     Palpations: Abdomen is soft.     Tenderness: There is no abdominal tenderness. There is no guarding or rebound.  Musculoskeletal:        General: No deformity.     Cervical back: Neck supple.  Skin:    General: Skin is warm and dry.     Capillary Refill: Capillary refill takes less than 2 seconds.  Neurological:     General: No  focal deficit present.     Mental Status: He is alert.     (all labs ordered are listed, but only abnormal results are displayed) Labs Reviewed  CBC - Abnormal; Notable for the following components:      Result Value   RBC 3.32 (*)    Hemoglobin 10.6 (*)    HCT 31.7 (*)    All other components within normal limits  BASIC METABOLIC PANEL WITH GFR - Abnormal; Notable for the following components:   Sodium 133 (*)    Glucose, Bld 158 (*)    BUN 28 (*)    Creatinine, Ser 1.77 (*)    GFR, Estimated 40 (*)    All other components within normal limits  URINALYSIS, ROUTINE W REFLEX MICROSCOPIC - Abnormal; Notable for the following components:   Hgb urine dipstick LARGE (*)    Protein, ur 100 (*)    All other components within normal limits  URINALYSIS, MICROSCOPIC (REFLEX) - Abnormal; Notable for the following components:   Bacteria, UA RARE (*)    All other components within normal limits  URINE CULTURE  LACTIC ACID, PLASMA    EKG: None  Radiology: No results found.   Procedures   Medications Ordered in the ED  sodium chloride  0.9 % bolus 500 mL (has no administration in time range)  ketorolac  (TORADOL ) 30 MG/ML injection 15 mg (has no administration in time range)  fentaNYL  (SUBLIMAZE ) injection 50 mcg (has no administration in time range)    Clinical Course as of 07/01/24 1701  Sun Jul 01, 2024  1009 Patient is a improved.  Lab work showing slight worsening of renal function.  Awaiting urinalysis.  Got IV fluids Toradol  fentanyl . [MB]    Clinical Course User Index [MB] Towana Ozell BROCKS, MD                                 Medical Decision Making Amount and/or Complexity of Data Reviewed Labs: ordered.  Risk Prescription drug management.   This patient complains of right back right lower quadrant abdominal pain fever; this involves an extensive number of treatment Options and is a complaint that carries with it a high risk of complications and morbidity. The  differential includes nephrolithiasis, pyelonephritis, sepsis  I ordered, reviewed and interpreted labs, which included CBC with normal white count, hemoglobin slightly down from priors, chemistries with a rising creatinine, urinalysis without signs of infection, lactate normal I ordered medication IV pain medicine and fluids and reviewed PMP when indicated. Additional history obtained from patient's companion Previous records obtained and reviewed in epic including recent ED visit and CT Social determinants considered, no significant barriers Critical Interventions: None  After the interventions  stated above, I reevaluated the patient and found patient to be pain-free at this time Admission and further testing considered, he is comfortable plan for discharge.  He tells me he is hesitant to take the pain medicine because he has to care for his wife who has dementia.  He will try the pain medicine at night.  Recommended continued hydration and close outpatient follow-up with urology.  Return instructions discussed      Final diagnoses:  Renal colic on right side    ED Discharge Orders     None          Towana Ozell BROCKS, MD 07/01/24 [id number]

## 2024-07-02 LAB — URINE CULTURE: Culture: NO GROWTH

## 2024-07-06 ENCOUNTER — Other Ambulatory Visit: Payer: Self-pay | Admitting: Urology

## 2024-07-25 NOTE — Patient Instructions (Signed)
 SURGICAL WAITING ROOM VISITATION  Patients having surgery or a procedure may have no more than 2 support people in the waiting area - these visitors may rotate.    Children ages 30 and under will not be able to visit patients in [facility] under most circumstances.   Visitors with respiratory illnesses are discouraged from visiting and should remain at home.  If the patient needs to stay at the hospital during part of their recovery, the visitor guidelines for inpatient rooms apply. Pre-op nurse will coordinate an appropriate time for 1 support person to accompany patient in pre-op.  This support person may not rotate.    Please refer to the [facility] website for the visitor guidelines for Inpatients (after your surgery is over and you are in a regular room).       Your procedure is scheduled on:  07/30/2024    Report to [facility] Main Entrance    Report to admitting at  1000 AM   Call this number if you have problems the morning of surgery [phone]   Do not eat food  or drink liquids :After Midnight.                              If you have questions, please contact your surgeons office.       Oral Hygiene is also important to reduce your risk of infection.                                    Remember - BRUSH YOUR TEETH THE MORNING OF SURGERY WITH YOUR REGULAR TOOTHPASTE  DENTURES WILL BE REMOVED PRIOR TO SURGERY PLEASE DO NOT APPLY Poly grip OR ADHESIVES!!!   Do NOT smoke after Midnight   Stop all vitamins and herbal supplements 7 days before surgery.   Take these medicines the morning of surgery with A SIP OF WATER:  synthroid, flomax    DO NOT TAKE ANY ORAL DIABETIC MEDICATIONS DAY OF YOUR SURGERY  Bring CPAP mask and tubing day of surgery.                              You may not have any metal on your body including hair pins, jewelry, and body piercing             Do not wear make-up, lotions, powders, perfumes/cologne, or  deodorant  Do not wear nail polish including gel and S&S, artificial/acrylic nails, or any other type of covering on natural nails including finger and toenails. If you have artificial nails, gel coating, etc. that needs to be removed by a nail salon please have this removed prior to surgery or surgery may need to be canceled/ delayed if the surgeon/ anesthesia feels like they are unable to be safely monitored.   Do not shave  48 hours prior to surgery.               Men may shave face and neck.   Do not bring valuables to the hospital. [facility] IS NOT             RESPONSIBLE   FOR VALUABLES.   Contacts, glasses, dentures or bridgework may not be worn into surgery.   Bring small overnight bag day of surgery.   DO NOT [facility]  MEDICATIONS TO THE HOSPITAL. PHARMACY WILL DISPENSE MEDICATIONS LISTED ON YOUR MEDICATION LIST TO YOU DURING YOUR ADMISSION IN THE HOSPITAL!    Patients discharged on the day of surgery will not be allowed to drive home.  Someone NEEDS to stay with you for the first 24 hours after anesthesia.   Special Instructions: Bring a copy of your healthcare power of attorney and living will documents the day of surgery if you haven't scanned them before.              Please read over the following fact sheets you were given: IF YOU HAVE QUESTIONS ABOUT YOUR PRE-OP INSTRUCTIONS PLEASE CALL [phone].   If you received a COVID test during your pre-op visit  it is requested that you wear a mask when out in public, stay away from anyone that may not be feeling well and notify your surgeon if you develop symptoms. If you test positive for Covid or have been in contact with anyone that has tested positive in the last 10 days please notify you surgeon.     - Preparing for Surgery Before surgery, you can play an important role.  Because skin is not sterile, your skin needs to be as free of germs as possible.  You can reduce the number of germs on your skin by  washing with CHG (chlorahexidine gluconate) soap before surgery.  CHG is an antiseptic cleaner which kills germs and bonds with the skin to continue killing germs even after washing. Please DO NOT use if you have an allergy to CHG or antibacterial soaps.  If your skin becomes reddened/irritated stop using the CHG and inform your nurse when you arrive at Short Stay. Do not shave (including legs and underarms) for at least 48 hours prior to the first CHG shower.  You may shave your face/neck. Please follow these instructions carefully:  1.  Shower with CHG Soap the night before surgery and the  morning of Surgery.  2.  If you choose to wash your hair, wash your hair first as usual with your  normal  shampoo.  3.  After you shampoo, rinse your hair and body thoroughly to remove the  shampoo.                           4.  Use CHG as you would any other liquid soap.  You can apply chg directly  to the skin and wash                       Gently with a scrungie or clean washcloth.  5.  Apply the CHG Soap to your body ONLY FROM THE NECK DOWN.   Do not use on face/ open                           Wound or open sores. Avoid contact with eyes, ears mouth and genitals (private parts).                       Wash face,  Genitals (private parts) with your normal soap.             6.  Wash thoroughly, paying special attention to the area where your surgery  will be performed.  7.  Thoroughly rinse your body with warm water from the neck down.  8.  DO NOT shower/wash with  your normal soap after using and rinsing off  the CHG Soap.                9.  Pat yourself dry with a clean towel.            10.  Wear clean pajamas.            11.  Place clean sheets on your bed the night of your first shower and do not  sleep with pets. Day of Surgery : Do not apply any lotions/deodorants the morning of surgery.  Please wear clean clothes to the hospital/surgery center.  FAILURE TO FOLLOW THESE INSTRUCTIONS MAY RESULT IN THE  CANCELLATION OF YOUR SURGERY PATIENT SIGNATURE_________________________________  NURSE SIGNATURE__________________________________  ________________________________________________________________________

## 2024-07-25 NOTE — Progress Notes (Addendum)
 Anesthesia Review:  PCP: Erminio Lesches at [facility] [address]  Cardiologist : Michele LOV 12/30/23   PPM/ ICD: Device Orders: Rep Notified:  Chest x-ray : EKG :12/30/2023  Echo : Stress test: 10/08/22  Cardiac Cath :  11/05/2022   Activity level: can do a flight of stairs without difficutly , PT plays travel ball  Sleep Study/ CPAP : none  Fasting Blood Sugar :      / Checks Blood Sugar -- times a day:    Blood Thinner/ Instructions /Last Dose: ASA / Instructions/ Last Dose :    81 mg aspirin     12/14- cbc and bmp  Labs repeated on 07/26/24.    Wife has dementia .     BMP done 07/26/24 rotued to DR Carolee on 07/26/24.

## 2024-07-26 ENCOUNTER — Other Ambulatory Visit: Payer: Self-pay

## 2024-07-26 ENCOUNTER — Encounter (HOSPITAL_COMMUNITY): Payer: Self-pay

## 2024-07-26 ENCOUNTER — Encounter (HOSPITAL_COMMUNITY)
Admission: RE | Admit: 2024-07-26 | Discharge: 2024-07-26 | Disposition: A | Discharge status: Home / Self Care | Payer: Self-pay | Source: Ambulatory Visit | Attending: Urology | Admitting: Urology

## 2024-07-26 VITALS — BP 158/91 | HR 55 | Temp 98.5°F | Resp 16 | Ht 69.0 in | Wt 167.0 lb

## 2024-07-26 DIAGNOSIS — Z79899 Other long term (current) drug therapy: Secondary | ICD-10-CM | POA: Diagnosis not present

## 2024-07-26 DIAGNOSIS — E785 Hyperlipidemia, unspecified: Secondary | ICD-10-CM | POA: Insufficient documentation

## 2024-07-26 DIAGNOSIS — I129 Hypertensive chronic kidney disease with stage 1 through stage 4 chronic kidney disease, or unspecified chronic kidney disease: Secondary | ICD-10-CM | POA: Insufficient documentation

## 2024-07-26 DIAGNOSIS — Z01812 Encounter for preprocedural laboratory examination: Secondary | ICD-10-CM | POA: Diagnosis not present

## 2024-07-26 DIAGNOSIS — D631 Anemia in chronic kidney disease: Secondary | ICD-10-CM | POA: Insufficient documentation

## 2024-07-26 DIAGNOSIS — E039 Hypothyroidism, unspecified: Secondary | ICD-10-CM | POA: Diagnosis not present

## 2024-07-26 DIAGNOSIS — Z01818 Encounter for other preprocedural examination: Secondary | ICD-10-CM

## 2024-07-26 DIAGNOSIS — N183 Chronic kidney disease, stage 3 unspecified: Secondary | ICD-10-CM | POA: Insufficient documentation

## 2024-07-26 HISTORY — DX: Prediabetes: R73.03

## 2024-07-26 HISTORY — DX: Personal history of urinary calculi: Z87.442

## 2024-07-26 HISTORY — DX: Other specified postprocedural states: Z98.890

## 2024-07-26 LAB — CBC
HCT: 34.2 % — ABNORMAL LOW (ref 39.0–52.0)
Hemoglobin: 11 g/dL — ABNORMAL LOW (ref 13.0–17.0)
MCH: 31.5 pg (ref 26.0–34.0)
MCHC: 32.2 g/dL (ref 30.0–36.0)
MCV: 98 fL (ref 80.0–100.0)
Platelets: 207 K/uL (ref 150–400)
RBC: 3.49 MIL/uL — ABNORMAL LOW (ref 4.22–5.81)
RDW: 12.7 % (ref 11.5–15.5)
WBC: 4.1 K/uL (ref 4.0–10.5)
nRBC: 0 % (ref 0.0–0.2)

## 2024-07-26 LAB — BASIC METABOLIC PANEL WITH GFR
Anion gap: 9 (ref 5–15)
BUN: 34 mg/dL — ABNORMAL HIGH (ref 8–23)
CO2: 25 mmol/L (ref 22–32)
Calcium: 10 mg/dL (ref 8.9–10.3)
Chloride: 103 mmol/L (ref 98–111)
Creatinine, Ser: 1.65 mg/dL — ABNORMAL HIGH (ref 0.61–1.24)
GFR, Estimated: 44 mL/min — ABNORMAL LOW
Glucose, Bld: 97 mg/dL (ref 70–99)
Potassium: 4.3 mmol/L (ref 3.5–5.1)
Sodium: 137 mmol/L (ref 135–145)

## 2024-07-27 NOTE — Anesthesia Preprocedure Evaluation (Signed)
 "                                  Anesthesia Evaluation  Patient identified by MRN, date of birth, ID band Patient awake    Reviewed: Allergy & Precautions, NPO status , Patient's Chart, lab work & pertinent test results  History of Anesthesia Complications (+) PONV and history of anesthetic complications  Airway Mallampati: II  TM Distance: >3 FB Neck ROM: Full    Dental  (+) Teeth Intact, Dental Advisory Given   Pulmonary neg pulmonary ROS   breath sounds clear to auscultation       Cardiovascular hypertension, Pt. on medications + CAD   Rhythm:Regular Rate:Normal     Neuro/Psych negative neurological ROS  negative psych ROS   GI/Hepatic negative GI ROS, Neg liver ROS,,,  Endo/Other  negative endocrine ROS    Renal/GU negative Renal ROS Bladder dysfunction      Musculoskeletal negative musculoskeletal ROS (+)    Abdominal   Peds  Hematology negative hematology ROS (+)   Anesthesia Other Findings   Reproductive/Obstetrics                              Anesthesia Physical Anesthesia Plan  ASA: 2  Anesthesia Plan: General   Post-op Pain Management: Tylenol  PO (pre-op)*   Induction: Intravenous  PONV Risk Score and Plan: 4 or greater and Ondansetron , Dexamethasone  and Midazolam   Airway Management Planned: LMA  Additional Equipment: None  Intra-op Plan:   Post-operative Plan: Extubation in OR  Informed Consent: I have reviewed the patients History and Physical, chart, labs and discussed the procedure including the risks, benefits and alternatives for the proposed anesthesia with the patient or authorized representative who has indicated his/her understanding and acceptance.     Dental advisory given  Plan Discussed with: CRNA  Anesthesia Plan Comments: (PAT note by Lynwood Hope, PA-C: 74 year old male with pertinent history including PONV, HLD, CKD 3 by labs, HTN, hypothyroidism.  In 2024 he underwent  a thorough evaluation for complaint of chest pain.  Echo 08/2022 showed LVEF 55 to 60%, no significant valvular abnormalities.  Nuclear stress test 09/2022 was low risk but abnormal with possible reversible defect.  Due to persistent chest pain he ultimately underwent catheterization on 11/05/2022 which showed mild to moderate nonobstructive disease.  Patient's chest pain subsequently resolved.  When last seen by Dr. Michele on 12/30/2023 he was noted to be doing well, playing travel softball with no exertional symptoms or change in endurance.  He was recommended to continue current medications and follow-up in 2 years.  Preop labs reviewed, creatinine mildly elevated 1.65 (most recent comparison 1.77 on 07/01/2024), mild anemia hemoglobin 11.0, otherwise unremarkable.  EKG 12/30/2023: Sinus bradycardia with first-degree AV block with PACs.  Rate 54.  Cath 11/05/2022: LV: 137/7, EDP 18 mmHg.  Ao 146/66, mean 98 mmHg.  No pressure gradient of aortic valve. RCA: Dominant, minimal disease. LM: Large-caliber vessel, mildly calcified. LAD: Moderate caliber vessel, diffuse mild coronary calcification is evident.  Mid segment has a 50 to 60% stenosis. LCx: Large-caliber vessel, gives origin to small marginals.  Has mild diffuse calcification and mild luminal irregularity in the proximal segment.  Impression: Mild inferior ischemia could be in the distribution of the mid LAD stenosis however the lesion is mildly calcified and very stable.  Since being on beta-blocker therapy and also  high intensity statins, patient states that he has started to feel better and he has not had any further chest pain.  Hence continued medical therapy is indicated.  No restrictions from cardiac standpoint.   TTE 09/14/2022: Normal LV systolic function with visual EF 55-60%. Left ventricle cavity  is normal in size. Normal left ventricular wall thickness. Normal global  wall motion. Normal diastolic filling pattern. Calculated EF 62%.   Structurally normal tricuspid valve with trace regurgitation. No evidence  of pulmonary hypertension.  No prior available for comparison.    )         Anesthesia Quick Evaluation  "

## 2024-07-27 NOTE — Progress Notes (Signed)
 Anesthesia Chart Review:  74 year old male with pertinent history including PONV, HLD, CKD 3 by labs, HTN, hypothyroidism.  In 2024 he underwent a thorough evaluation for complaint of chest pain.  Echo 08/2022 showed LVEF 55 to 60%, no significant valvular abnormalities.  Nuclear stress test 09/2022 was low risk but abnormal with possible reversible defect.  Due to persistent chest pain he ultimately underwent catheterization on 11/05/2022 which showed mild to moderate nonobstructive disease.  Patient's chest pain subsequently resolved.  When last seen by Dr. Michele on 12/30/2023 he was noted to be doing well, playing travel softball with no exertional symptoms or change in endurance.  He was recommended to continue current medications and follow-up in 2 years.  Preop labs reviewed, creatinine mildly elevated 1.65 (most recent comparison 1.77 on 07/01/2024), mild anemia hemoglobin 11.0, otherwise unremarkable.  EKG 12/30/2023: Sinus bradycardia with first-degree AV block with PACs.  Rate 54.  Cath 11/05/2022: LV: 137/7, EDP 18 mmHg.  Ao 146/66, mean 98 mmHg.  No pressure gradient of aortic valve. RCA: Dominant, minimal disease. LM: Large-caliber vessel, mildly calcified. LAD: Moderate caliber vessel, diffuse mild coronary calcification is evident.  Mid segment has a 50 to 60% stenosis. LCx: Large-caliber vessel, gives origin to small marginals.  Has mild diffuse calcification and mild luminal irregularity in the proximal segment.  Impression: Mild inferior ischemia could be in the distribution of the mid LAD stenosis however the lesion is mildly calcified and very stable.  Since being on beta-blocker therapy and also high intensity statins, patient states that he has started to feel better and he has not had any further chest pain.  Hence continued medical therapy is indicated.  No restrictions from cardiac standpoint.   TTE 09/14/2022: Normal LV systolic function with visual EF 55-60%. Left ventricle  cavity  is normal in size. Normal left ventricular wall thickness. Normal global  wall motion. Normal diastolic filling pattern. Calculated EF 62%.  Structurally normal tricuspid valve with trace regurgitation. No evidence  of pulmonary hypertension.  No prior available for comparison.     Lynwood Geofm RIGGERS [facility] Short Stay Center/Anesthesiology Phone [phone] 07/27/2024 8:24 AM

## 2024-07-30 ENCOUNTER — Encounter (HOSPITAL_COMMUNITY)
Admission: RE | Disposition: A | Discharge status: Home / Self Care | Payer: Self-pay | Source: Home / Self Care | Attending: Urology

## 2024-07-30 ENCOUNTER — Encounter (HOSPITAL_COMMUNITY): Payer: Self-pay | Admitting: Physician Assistant

## 2024-07-30 ENCOUNTER — Encounter (HOSPITAL_COMMUNITY): Payer: Self-pay | Admitting: Urology

## 2024-07-30 ENCOUNTER — Ambulatory Visit (HOSPITAL_COMMUNITY): Payer: Self-pay

## 2024-07-30 ENCOUNTER — Ambulatory Visit (HOSPITAL_COMMUNITY)

## 2024-07-30 ENCOUNTER — Ambulatory Visit (HOSPITAL_COMMUNITY)
Admission: RE | Admit: 2024-07-30 | Discharge: 2024-07-30 | Disposition: A | Discharge status: Home / Self Care | Payer: Self-pay | Attending: Urology | Admitting: Urology

## 2024-07-30 DIAGNOSIS — N201 Calculus of ureter: Secondary | ICD-10-CM | POA: Diagnosis not present

## 2024-07-30 DIAGNOSIS — Z79899 Other long term (current) drug therapy: Secondary | ICD-10-CM | POA: Insufficient documentation

## 2024-07-30 DIAGNOSIS — I10 Essential (primary) hypertension: Secondary | ICD-10-CM | POA: Diagnosis not present

## 2024-07-30 DIAGNOSIS — N202 Calculus of kidney with calculus of ureter: Secondary | ICD-10-CM | POA: Diagnosis present

## 2024-07-30 DIAGNOSIS — Z01818 Encounter for other preprocedural examination: Secondary | ICD-10-CM

## 2024-07-30 HISTORY — PX: CYSTOSCOPY/URETEROSCOPY/HOLMIUM LASER/STENT PLACEMENT: SHX6546

## 2024-07-30 SURGERY — CYSTOSCOPY/URETEROSCOPY/HOLMIUM LASER/STENT PLACEMENT
Anesthesia: General | Laterality: Right

## 2024-07-30 MED ORDER — LIDOCAINE HCL (PF) 2 % IJ SOLN
INTRAMUSCULAR | Status: AC
  Filled 2024-07-30: qty 5

## 2024-07-30 MED ORDER — FENTANYL CITRATE (PF) 100 MCG/2ML IJ SOLN
INTRAMUSCULAR | Status: DC | PRN
  Administered 2024-07-30 (×2): 25 ug via INTRAVENOUS
  Administered 2024-07-30: 50 ug via INTRAVENOUS

## 2024-07-30 MED ORDER — ORAL CARE MOUTH RINSE: 15.0000 mL | Freq: Once | OROMUCOSAL | Status: DC

## 2024-07-30 MED ORDER — OXYCODONE HCL 5 MG/5ML PO SOLN: 5.0000 mg | Freq: Once | ORAL | Status: DC | PRN

## 2024-07-30 MED ORDER — DEXAMETHASONE SOD PHOSPHATE PF 10 MG/ML IJ SOLN
INTRAMUSCULAR | Status: AC
  Filled 2024-07-30: qty 1

## 2024-07-30 MED ORDER — HYDROCODONE-ACETAMINOPHEN 5-325 MG PO TABS: 1.0000 | ORAL_TABLET | ORAL | 0 refills | Status: AC | PRN

## 2024-07-30 MED ORDER — IOHEXOL 300 MG/ML  SOLN
INTRAMUSCULAR | Status: DC | PRN
  Administered 2024-07-30: 10 mL

## 2024-07-30 MED ORDER — DROPERIDOL 2.5 MG/ML IJ SOLN: 0.6250 mg | Freq: Once | INTRAMUSCULAR | Status: DC | PRN

## 2024-07-30 MED ORDER — ONDANSETRON HCL 4 MG/2ML IJ SOLN
INTRAMUSCULAR | Status: AC
  Filled 2024-07-30: qty 2

## 2024-07-30 MED ORDER — LACTATED RINGERS IV SOLN: INTRAVENOUS | Status: DC

## 2024-07-30 MED ORDER — CEFAZOLIN SODIUM-DEXTROSE 2-4 GM/100ML-% IV SOLN
2.0000 g | INTRAVENOUS | Status: AC
  Administered 2024-07-30: 2 g via INTRAVENOUS
  Filled 2024-07-30: qty 100

## 2024-07-30 MED ORDER — DEXAMETHASONE SOD PHOSPHATE PF 10 MG/ML IJ SOLN
INTRAMUSCULAR | Status: DC | PRN
  Administered 2024-07-30: 4 mg via INTRAVENOUS

## 2024-07-30 MED ORDER — FENTANYL CITRATE (PF) 50 MCG/ML IJ SOSY: 25.0000 ug | PREFILLED_SYRINGE | INTRAMUSCULAR | Status: DC | PRN

## 2024-07-30 MED ORDER — MIDAZOLAM HCL 2 MG/2ML IJ SOLN
INTRAMUSCULAR | Status: AC
  Filled 2024-07-30: qty 2

## 2024-07-30 MED ORDER — CHLORHEXIDINE GLUCONATE 0.12 % MT SOLN: 15.0000 mL | Freq: Once | OROMUCOSAL | Status: DC

## 2024-07-30 MED ORDER — ACETAMINOPHEN 500 MG PO TABS: 1000.0000 mg | ORAL_TABLET | Freq: Once | ORAL | Status: DC

## 2024-07-30 MED ORDER — OXYCODONE HCL 5 MG PO TABS: 5.0000 mg | ORAL_TABLET | Freq: Once | ORAL | Status: DC | PRN

## 2024-07-30 MED ORDER — PROPOFOL 500 MG/50ML IV EMUL
INTRAVENOUS | Status: DC | PRN
  Administered 2024-07-30: 150 ug/kg/min via INTRAVENOUS

## 2024-07-30 MED ORDER — PROPOFOL 10 MG/ML IV BOLUS
INTRAVENOUS | Status: DC | PRN
  Administered 2024-07-30: 150 mg via INTRAVENOUS

## 2024-07-30 MED ORDER — FENTANYL CITRATE (PF) 100 MCG/2ML IJ SOLN
INTRAMUSCULAR | Status: AC
  Filled 2024-07-30: qty 2

## 2024-07-30 MED ORDER — ACETAMINOPHEN 10 MG/ML IV SOLN: 1000.0000 mg | Freq: Once | INTRAVENOUS | Status: DC | PRN

## 2024-07-30 MED ORDER — LIDOCAINE HCL (CARDIAC) PF 100 MG/5ML IV SOSY
PREFILLED_SYRINGE | INTRAVENOUS | Status: DC | PRN
  Administered 2024-07-30: 50 mg via INTRAVENOUS

## 2024-07-30 MED ORDER — PROPOFOL 10 MG/ML IV BOLUS
INTRAVENOUS | Status: AC
  Filled 2024-07-30: qty 20

## 2024-07-30 MED ORDER — SODIUM CHLORIDE 0.9 % IR SOLN
Status: DC | PRN
  Administered 2024-07-30: 3000 mL via INTRAVESICAL

## 2024-07-30 MED ORDER — ONDANSETRON HCL 4 MG/2ML IJ SOLN
INTRAMUSCULAR | Status: DC | PRN
  Administered 2024-07-30: 4 mg via INTRAVENOUS

## 2024-07-30 MED ORDER — SCOPOLAMINE 1 MG/3DAYS TD PT72: 1.0000 | MEDICATED_PATCH | TRANSDERMAL | Status: DC

## 2024-07-30 SURGICAL SUPPLY — 19 items
BAG URO CATCHER STRL LF (MISCELLANEOUS) ×1 IMPLANT
BASKET LASER NITINOL 1.9FR (BASKET) IMPLANT
BASKET ZERO TIP NITINOL 2.4FR (BASKET) IMPLANT
CATH URETERAL DUAL LUMEN 10F (MISCELLANEOUS) IMPLANT
CATH URETL OPEN END 6FR 70 (CATHETERS) ×1 IMPLANT
CLOTH BEACON ORANGE TIMEOUT ST (SAFETY) ×1 IMPLANT
GLOVE BIO SURGEON STRL SZ7.5 (GLOVE) ×1 IMPLANT
GOWN STRL REUS W/ TWL XL LVL3 (GOWN DISPOSABLE) ×1 IMPLANT
GUIDEWIRE ANG ZIPWIRE 038X150 (WIRE) IMPLANT
GUIDEWIRE STR DUAL SENSOR (WIRE) ×1 IMPLANT
KIT TURNOVER KIT A (KITS) ×1 IMPLANT
MANIFOLD NEPTUNE II (INSTRUMENTS) ×1 IMPLANT
PACK CYSTO (CUSTOM PROCEDURE TRAY) ×1 IMPLANT
SHEATH NAVIGATOR HD 11/13X28 (SHEATH) IMPLANT
SHEATH NAVIGATOR HD 11/13X36 (SHEATH) IMPLANT
STENT CONTOUR 6FRX26X.038 (STENTS) IMPLANT
TRACTIP FLEXIVA PULS ID 200XHI (Laser) IMPLANT
TUBING CONNECTING 10 (TUBING) ×1 IMPLANT
TUBING UROLOGY SET (TUBING) ×1 IMPLANT

## 2024-07-30 NOTE — Anesthesia Procedure Notes (Signed)
 Procedure Name: LMA Insertion Date/Time: 07/30/2024 1:04 PM  Performed by: Metta Andrea NOVAK, CRNAPre-anesthesia Checklist: Patient identified, Emergency Drugs available, Suction available, Patient being monitored and Timeout performed Patient Re-evaluated:Patient Re-evaluated prior to induction Oxygen Delivery Method: Circle system utilized Preoxygenation: Pre-oxygenation with 100% oxygen Induction Type: IV induction Ventilation: Mask ventilation without difficulty LMA: LMA inserted LMA Size: 4.0 Number of attempts: 1 Placement Confirmation: positive ETCO2 and breath sounds checked- equal and bilateral Tube secured with: Tape Dental Injury: Teeth and Oropharynx as per pre-operative assessment

## 2024-07-30 NOTE — Interval H&P Note (Signed)
 History and Physical Interval Note:  07/30/2024 10:41 AM  Cory Reid  has presented today for surgery, with the diagnosis of RIGHT KIDNEY AND URETERAL STONE.  The various methods of treatment have been discussed with the patient and family. After consideration of risks, benefits and other options for treatment, the patient has consented to  Procedures: CYSTOSCOPY/URETEROSCOPY/HOLMIUM LASER/STENT PLACEMENT (Right) as a surgical intervention.  The patient's history has been reviewed, patient examined, no change in status, stable for surgery.  I have reviewed the patient's chart and labs.  Questions were answered to the patient's satisfaction.     [place] JONETTA Edison, III

## 2024-07-30 NOTE — Discharge Instructions (Signed)

## 2024-07-30 NOTE — Op Note (Signed)
 Operative Note  Preoperative diagnosis:  1.  Right renal and ureteral calculi  Postoperative diagnosis: 1.  Right renal and ureteral calculi  Procedure(s): 1.  Cystoscopy with right retrograde pyelogram, right ureteroscopy with laser lithotripsy and stone basketing, ureteral stent placement  Surgeon: Sherwood Edison, MD  Assistants: None  Anesthesia: General  Complications: None immediate  EBL: Minimal  Specimens: 1.  Ureteral calculus  Drains/Catheters: 1.  6 x 26 double-J ureteral stent  Intraoperative findings: 1.  Normal anterior urethra 2.  Borderline obstructive prostate 3.  Bladder mucosa without any tumors stones or masses 4.  Right ureteral vesicular junction was narrow but ultimately able to be transversed with the single lumen semirigid ureteroscope.  Had a small ureteral calculus that was fragmented to small fragments and basket extracted. 4.  Remainder of ureter free of stone. 5.  Retrograde pyelogram on the right revealed mild to moderate hydronephrosis.  Indication: 74 year old male with a right ureteral calculus and right renal calculi presents for the previously mentioned operation.  Description of procedure:  The patient was identified and consent was obtained.  The patient was taken to the operating room and placed in the supine position.  The patient was placed under general anesthesia.  Perioperative antibiotics were administered.  The patient was placed in dorsal lithotomy.  Patient was prepped and draped in a standard sterile fashion and a timeout was performed.  A 21 French rigid cystoscope was advanced into the urethra and into the bladder.  Complete cystoscopy was performed with findings noted above.  Right ureter was cannulated with a sensor wire which was advanced up to the kidney under fluoroscopic guidance.  Semirigid ureteroscopy was performed alongside the wire.  The ureteral vesicular junction was narrow but able to be transversed with the semirigid  ureteroscope.  I encountered the stone in the distal ureter and this was fragmented to smaller fragments followed by basket extraction.  I advanced scope up to the renal pelvis and no other calculi were seen.  I showed retrograde pyelogram through the scope with findings noted above.  I withdrew the scope visualizing the ureter upon removal.  There was some inflammation and edema at the ureterovesicular junction and given that it was narrow, I did not feel it was appropriate to try to place the sheath to access the kidney to treat the small stone burden up there.  I therefore elected to place a stent.  I backloaded the wire onto rigid cystoscope and advanced that into the bladder followed by routine placement of a 6 x 26 double-J ureteral stent.  Fluoroscopy confirmed proximal placement and direct visualization confirmed a good coil in the bladder.  I drained the bladder and withdrew the scope.  Patient tolerated the procedure well was stable postoperatively.  Plan: Follow-up in 1 week.  He has 2 different options.  First option is to remove the stent and observe the right renal calculi that are nonobstructing.  The second option is to proceed back to the operating room for second stage ureteroscopy to clean out the remainder of the kidney.

## 2024-07-30 NOTE — Transfer of Care (Signed)
 Immediate Anesthesia Transfer of Care Note  Patient: Cory Reid  Procedure(s) Performed: CYSTOSCOPY/URETEROSCOPY/HOLMIUM LASER/STENT PLACEMENT (Right)  Patient Location: PACU  Anesthesia Type:General  Level of Consciousness: awake, alert , and patient cooperative  Airway & Oxygen Therapy: Patient Spontanous Breathing and Patient connected to face mask oxygen  Post-op Assessment: Report given to RN and Post -op Vital signs reviewed and stable  Post vital signs: Reviewed and stable  Last Vitals:  Vitals Value Taken Time  BP 128/84   Temp    Pulse 58 07/30/24 13:46  Resp 12 07/30/24 13:46  SpO2 100 % 07/30/24 13:46  Vitals shown include unfiled device data.  Last Pain:  Vitals:   07/30/24 0956  TempSrc: Oral  PainSc: 0-No pain         Complications: No notable events documented.

## 2024-07-30 NOTE — Anesthesia Postprocedure Evaluation (Signed)
"   Anesthesia Post Note  Patient: Cory Reid  Procedure(s) Performed: CYSTOSCOPY/URETEROSCOPY/HOLMIUM LASER/STENT PLACEMENT (Right)     Patient location during evaluation: PACU Anesthesia Type: General Level of consciousness: awake and alert Pain management: pain level controlled Vital Signs Assessment: post-procedure vital signs reviewed and stable Respiratory status: spontaneous breathing, nonlabored ventilation, respiratory function stable and patient connected to nasal cannula oxygen Cardiovascular status: blood pressure returned to baseline and stable Postop Assessment: no apparent nausea or vomiting Anesthetic complications: no   No notable events documented.  Last Vitals:  Vitals:   07/30/24 1415 07/30/24 1437  BP: 119/79 139/82  Pulse: (!) 51 (!) 50  Resp: 11 20  Temp: (!) 36.3 C (!) 36.3 C  SpO2: 100% 100%    Last Pain:  Vitals:   07/30/24 1437  TempSrc: Oral  PainSc: 0-No pain                 Franky JONETTA Bald      "

## 2024-07-31 ENCOUNTER — Encounter (HOSPITAL_COMMUNITY): Payer: Self-pay | Admitting: Urology

## 2024-08-08 LAB — STONE ANALYSIS
Calcium Oxalate Monohydrate: 100 %
Weight Calculi: 24 mg
